# Patient Record
Sex: Female | Born: 1985 | Race: Black or African American | Hispanic: No | Marital: Single | Smoking: Never smoker
Health system: Southern US, Community
[De-identification: ages and names within clinical notes are randomized; demographics above are authoritative.]

## PROBLEM LIST (undated history)

## (undated) DIAGNOSIS — K219 Gastro-esophageal reflux disease without esophagitis: Secondary | ICD-10-CM

## (undated) DIAGNOSIS — T8859XA Other complications of anesthesia, initial encounter: Secondary | ICD-10-CM

## (undated) DIAGNOSIS — E669 Obesity, unspecified: Secondary | ICD-10-CM

## (undated) DIAGNOSIS — I10 Essential (primary) hypertension: Secondary | ICD-10-CM

## (undated) DIAGNOSIS — D649 Anemia, unspecified: Secondary | ICD-10-CM

## (undated) DIAGNOSIS — K8020 Calculus of gallbladder without cholecystitis without obstruction: Secondary | ICD-10-CM

## (undated) DIAGNOSIS — F419 Anxiety disorder, unspecified: Secondary | ICD-10-CM

## (undated) DIAGNOSIS — G43909 Migraine, unspecified, not intractable, without status migrainosus: Secondary | ICD-10-CM

---

## 2009-05-03 ENCOUNTER — Emergency Department (HOSPITAL_BASED_OUTPATIENT_CLINIC_OR_DEPARTMENT_OTHER): Admission: EM | Admit: 2009-05-03 | Discharge: 2009-05-03 | Payer: Self-pay | Admitting: Emergency Medicine

## 2009-06-13 ENCOUNTER — Emergency Department (HOSPITAL_BASED_OUTPATIENT_CLINIC_OR_DEPARTMENT_OTHER): Admission: EM | Admit: 2009-06-13 | Discharge: 2009-06-13 | Payer: Self-pay | Admitting: Emergency Medicine

## 2009-07-05 ENCOUNTER — Emergency Department (HOSPITAL_BASED_OUTPATIENT_CLINIC_OR_DEPARTMENT_OTHER): Admission: EM | Admit: 2009-07-05 | Discharge: 2009-07-05 | Payer: Self-pay | Admitting: Emergency Medicine

## 2009-11-17 ENCOUNTER — Emergency Department (HOSPITAL_BASED_OUTPATIENT_CLINIC_OR_DEPARTMENT_OTHER): Admission: EM | Admit: 2009-11-17 | Discharge: 2009-11-17 | Payer: Self-pay | Admitting: Emergency Medicine

## 2009-11-17 ENCOUNTER — Ambulatory Visit: Payer: Self-pay | Admitting: Radiology

## 2010-03-06 ENCOUNTER — Emergency Department (HOSPITAL_BASED_OUTPATIENT_CLINIC_OR_DEPARTMENT_OTHER): Admission: EM | Admit: 2010-03-06 | Discharge: 2010-03-07 | Payer: Self-pay | Admitting: Emergency Medicine

## 2010-06-22 ENCOUNTER — Emergency Department (HOSPITAL_BASED_OUTPATIENT_CLINIC_OR_DEPARTMENT_OTHER): Admission: EM | Admit: 2010-06-22 | Discharge: 2010-06-22 | Payer: Self-pay | Admitting: Emergency Medicine

## 2011-01-20 LAB — URINE MICROSCOPIC-ADD ON

## 2011-01-20 LAB — URINALYSIS, ROUTINE W REFLEX MICROSCOPIC
Glucose, UA: NEGATIVE mg/dL
Protein, ur: NEGATIVE mg/dL
pH: 5.5 (ref 5.0–8.0)

## 2011-01-20 LAB — URINE CULTURE: Colony Count: 25000

## 2011-02-11 LAB — URINE MICROSCOPIC-ADD ON

## 2011-02-11 LAB — URINALYSIS, ROUTINE W REFLEX MICROSCOPIC
Bilirubin Urine: NEGATIVE
Glucose, UA: NEGATIVE mg/dL
Protein, ur: NEGATIVE mg/dL

## 2011-09-29 ENCOUNTER — Emergency Department (INDEPENDENT_AMBULATORY_CARE_PROVIDER_SITE_OTHER): Payer: Self-pay

## 2011-09-29 ENCOUNTER — Emergency Department (HOSPITAL_BASED_OUTPATIENT_CLINIC_OR_DEPARTMENT_OTHER)
Admission: EM | Admit: 2011-09-29 | Discharge: 2011-09-29 | Disposition: A | Discharge status: Home / Self Care | Payer: Self-pay | Attending: Emergency Medicine | Admitting: Emergency Medicine

## 2011-09-29 DIAGNOSIS — R0989 Other specified symptoms and signs involving the circulatory and respiratory systems: Secondary | ICD-10-CM

## 2011-09-29 DIAGNOSIS — R05 Cough: Secondary | ICD-10-CM | POA: Insufficient documentation

## 2011-09-29 DIAGNOSIS — R509 Fever, unspecified: Secondary | ICD-10-CM | POA: Insufficient documentation

## 2011-09-29 DIAGNOSIS — R059 Cough, unspecified: Secondary | ICD-10-CM | POA: Insufficient documentation

## 2011-09-29 DIAGNOSIS — J40 Bronchitis, not specified as acute or chronic: Secondary | ICD-10-CM | POA: Insufficient documentation

## 2011-09-29 MED ORDER — AZITHROMYCIN 250 MG PO TABS: ORAL_TABLET | ORAL | Status: AC

## 2011-09-29 MED ORDER — ALBUTEROL SULFATE (5 MG/ML) 0.5% IN NEBU
5.0000 mg | INHALATION_SOLUTION | Freq: Once | RESPIRATORY_TRACT | Status: AC
  Administered 2011-09-29: 5 mg via RESPIRATORY_TRACT
  Filled 2011-09-29: qty 0.5

## 2011-09-29 MED ORDER — ALBUTEROL SULFATE HFA 108 (90 BASE) MCG/ACT IN AERS: 2.0000 | INHALATION_SPRAY | RESPIRATORY_TRACT | Status: DC | PRN

## 2011-09-29 NOTE — ED Notes (Signed)
Cough/wheezing x 1.5 months

## 2011-09-29 NOTE — ED Provider Notes (Signed)
History     CSN: [id number] Arrival date & time: 09/29/2011 11:15 AM   First MD Initiated Contact with Patient 09/29/11 1121      Chief Complaint  Patient presents with  . Cough    (Consider location/radiation/quality/duration/timing/severity/associated sxs/prior treatment) Patient is a 25 y.o. female presenting with cough. The history is provided by the patient.  Cough Pertinent negatives include no chest pain, no chills, no headaches, no shortness of breath and no eye redness.  pt c/o non prod cough for past few weeks. Feels wheezy at times. Denies hx asthma. Non smoker. No chest pain. No sob. Subjective fever. Mild nasal congestion. No sore throat. No headache. No known ill contacts. Cough episodic, no specific exacerbating or allev factors.   History reviewed. No pertinent past medical history.  History reviewed. No pertinent past surgical history.  No family history on file.  History  Substance Use Topics  . Smoking status: Never Smoker   . Smokeless tobacco: Not on file  . Alcohol Use: No    OB History    Grav Para Term Preterm Abortions TAB SAB Ect Mult Living                  Review of Systems  Constitutional: Positive for fever. Negative for chills.  HENT: Negative for neck pain and neck stiffness.   Eyes: Negative for redness.  Respiratory: Positive for cough. Negative for shortness of breath.   Cardiovascular: Negative for chest pain.  Gastrointestinal: Negative for abdominal pain.  Genitourinary: Negative for flank pain.  Musculoskeletal: Negative for back pain.  Skin: Negative for rash.  Neurological: Negative for headaches.  Hematological: Does not bruise/bleed easily.  Psychiatric/Behavioral: Negative for confusion.    Allergies  Review of patient's allergies indicates no known allergies.  Home Medications  No current outpatient prescriptions on file.  BP 143/81  Pulse 86  Temp(Src) 99.3 F (37.4 C) (Oral)  Resp 18  Ht 5\' 5"  (1.651 m)   Wt 200 lb (90.719 kg)  BMI 33.28 kg/m2  SpO2 100%  LMP 09/26/2011  Physical Exam  Nursing note and vitals reviewed. Constitutional: She is oriented to person, place, and time. She appears well-developed and well-nourished. No distress.  HENT:  Mouth/Throat: Oropharynx is clear and moist.  Eyes: Conjunctivae are normal. No scleral icterus.  Neck: Normal range of motion. Neck supple. No tracheal deviation present.       No stiffness/rigidity  Cardiovascular: Normal rate, regular rhythm, normal heart sounds and intact distal pulses.  Exam reveals no friction rub.   No murmur heard. Pulmonary/Chest: Effort normal. No respiratory distress. She has no rales.       Sl exp wheeze.   Abdominal: Soft. Normal appearance. There is no tenderness.  Musculoskeletal: She exhibits no edema.  Neurological: She is alert and oriented to person, place, and time.  Skin: Skin is warm and dry. No rash noted.  Psychiatric: She has a normal mood and affect.    ED Course  Procedures (including critical care time)  Labs Reviewed - No data to display Dg Chest 2 View  09/29/2011  *RADIOLOGY REPORT*  Clinical Data: Cough, congestion  CHEST - 2 VIEW  Comparison: 11/17/09  Findings: Cardiomediastinal silhouette is stable.  No acute infiltrate or pleural effusion.  No pulmonary edema.  Bony thorax is stable.  IMPRESSION: No active disease.  No significant change.  Original Report Authenticated By: Natasha Mead, M.D.      No diagnosis found.    MDM  Nursing  notes reviewed. Cxr. Albuterol neb treatment.    Pt notes prolonged cough, subj fevers, congestion on exam. Will rx bronchitis.      Suzi Roots, MD 09/29/11 [phone]

## 2014-02-26 ENCOUNTER — Encounter (HOSPITAL_BASED_OUTPATIENT_CLINIC_OR_DEPARTMENT_OTHER): Payer: Self-pay | Admitting: Emergency Medicine

## 2014-02-26 ENCOUNTER — Emergency Department (HOSPITAL_BASED_OUTPATIENT_CLINIC_OR_DEPARTMENT_OTHER)
Admission: EM | Admit: 2014-02-26 | Discharge: 2014-02-26 | Disposition: A | Discharge status: Home / Self Care | Payer: Medicaid Other | Attending: Emergency Medicine | Admitting: Emergency Medicine

## 2014-02-26 DIAGNOSIS — I10 Essential (primary) hypertension: Secondary | ICD-10-CM | POA: Insufficient documentation

## 2014-02-26 DIAGNOSIS — Z3202 Encounter for pregnancy test, result negative: Secondary | ICD-10-CM | POA: Insufficient documentation

## 2014-02-26 DIAGNOSIS — R51 Headache: Secondary | ICD-10-CM | POA: Insufficient documentation

## 2014-02-26 DIAGNOSIS — R42 Dizziness and giddiness: Secondary | ICD-10-CM | POA: Insufficient documentation

## 2014-02-26 DIAGNOSIS — R109 Unspecified abdominal pain: Secondary | ICD-10-CM

## 2014-02-26 DIAGNOSIS — M549 Dorsalgia, unspecified: Secondary | ICD-10-CM | POA: Insufficient documentation

## 2014-02-26 DIAGNOSIS — R1084 Generalized abdominal pain: Secondary | ICD-10-CM | POA: Insufficient documentation

## 2014-02-26 DIAGNOSIS — R519 Headache, unspecified: Secondary | ICD-10-CM

## 2014-02-26 DIAGNOSIS — Z79899 Other long term (current) drug therapy: Secondary | ICD-10-CM | POA: Insufficient documentation

## 2014-02-26 HISTORY — DX: Essential (primary) hypertension: I10

## 2014-02-26 LAB — URINALYSIS, ROUTINE W REFLEX MICROSCOPIC
Bilirubin Urine: NEGATIVE
GLUCOSE, UA: NEGATIVE mg/dL
Ketones, ur: NEGATIVE mg/dL
LEUKOCYTES UA: NEGATIVE
NITRITE: NEGATIVE
PROTEIN: NEGATIVE mg/dL
Specific Gravity, Urine: 1.028 (ref 1.005–1.030)
UROBILINOGEN UA: 0.2 mg/dL (ref 0.0–1.0)
pH: 5.5 (ref 5.0–8.0)

## 2014-02-26 LAB — URINE MICROSCOPIC-ADD ON

## 2014-02-26 LAB — PREGNANCY, URINE: PREG TEST UR: NEGATIVE

## 2014-02-26 MED ORDER — LANSOPRAZOLE 30 MG PO CPDR: 30.0000 mg | DELAYED_RELEASE_CAPSULE | Freq: Every day | ORAL | Status: DC

## 2014-02-26 MED ORDER — DOXYCYCLINE HYCLATE 100 MG PO CAPS: 100.0000 mg | ORAL_CAPSULE | Freq: Two times a day (BID) | ORAL | Status: DC

## 2014-02-26 MED ORDER — METRONIDAZOLE 500 MG PO TABS: 500.0000 mg | ORAL_TABLET | Freq: Two times a day (BID) | ORAL | Status: DC

## 2014-02-26 MED ORDER — SUMATRIPTAN SUCCINATE 100 MG PO TABS: 100.0000 mg | ORAL_TABLET | ORAL | Status: DC | PRN

## 2014-02-26 NOTE — Discharge Instructions (Signed)
Abdominal Pain, Women °Abdominal (stomach, pelvic, or belly) pain can be caused by many things. It is important to tell your doctor: °· The location of the pain. °· Does it come and go or is it present all the time? °· Are there things that start the pain (eating certain foods, exercise)? °· Are there other symptoms associated with the pain (fever, nausea, vomiting, diarrhea)? °All of this is helpful to know when trying to find the cause of the pain. °CAUSES  °· Stomach: virus or bacteria infection, or ulcer. °· Intestine: appendicitis (inflamed appendix), regional ileitis (Crohn's disease), ulcerative colitis (inflamed colon), irritable bowel syndrome, diverticulitis (inflamed diverticulum of the colon), or cancer of the stomach or intestine. °· Gallbladder disease or stones in the gallbladder. °· Kidney disease, kidney stones, or infection. °· Pancreas infection or cancer. °· Fibromyalgia (pain disorder). °· Diseases of the female organs: °· Uterus: fibroid (non-cancerous) tumors or infection. °· Fallopian tubes: infection or tubal pregnancy. °· Ovary: cysts or tumors. °· Pelvic adhesions (scar tissue). °· Endometriosis (uterus lining tissue growing in the pelvis and on the pelvic organs). °· Pelvic congestion syndrome (female organs filling up with blood just before the menstrual period). °· Pain with the menstrual period. °· Pain with ovulation (producing an egg). °· Pain with an IUD (intrauterine device, birth control) in the uterus. °· Cancer of the female organs. °· Functional pain (pain not caused by a disease, may improve without treatment). °· Psychological pain. °· Depression. °DIAGNOSIS  °Your doctor will decide the seriousness of your pain by doing an examination. °· Blood tests. °· X-rays. °· Ultrasound. °· CT scan (computed tomography, special type of X-ray). °· MRI (magnetic resonance imaging). °· Cultures, for infection. °· Barium enema (dye inserted in the large intestine, to better view it with  X-rays). °· Colonoscopy (looking in intestine with a lighted tube). °· Laparoscopy (minor surgery, looking in abdomen with a lighted tube). °· Major abdominal exploratory surgery (looking in abdomen with a large incision). °TREATMENT  °The treatment will depend on the cause of the pain.  °· Many cases can be observed and treated at home. °· Over-the-counter medicines recommended by your caregiver. °· Prescription medicine. °· Antibiotics, for infection. °· Birth control pills, for painful periods or for ovulation pain. °· Hormone treatment, for endometriosis. °· Nerve blocking injections. °· Physical therapy. °· Antidepressants. °· Counseling with a psychologist or psychiatrist. °· Minor or major surgery. °HOME CARE INSTRUCTIONS  °· Do not take laxatives, unless directed by your caregiver. °· Take over-the-counter pain medicine only if ordered by your caregiver. Do not take aspirin because it can cause an upset stomach or bleeding. °· Try a clear liquid diet (broth or water) as ordered by your caregiver. Slowly move to a bland diet, as tolerated, if the pain is related to the stomach or intestine. °· Have a thermometer and take your temperature several times a day, and record it. °· Bed rest and sleep, if it helps the pain. °· Avoid sexual intercourse, if it causes pain. °· Avoid stressful situations. °· Keep your follow-up appointments and tests, as your caregiver orders. °· If the pain does not go away with medicine or surgery, you may try: °· Acupuncture. °· Relaxation exercises (yoga, meditation). °· Group therapy. °· Counseling. °SEEK MEDICAL CARE IF:  °· You notice certain foods cause stomach pain. °· Your home care treatment is not helping your pain. °· You need stronger pain medicine. °· You want your IUD removed. °· You feel faint or   lightheaded.  You develop nausea and vomiting.  You develop a rash.  You are having side effects or an allergy to your medicine. SEEK IMMEDIATE MEDICAL CARE IF:   Your  pain does not go away or gets worse.  You have a fever.  Your pain is felt only in portions of the abdomen. The right side could possibly be appendicitis. The left lower portion of the abdomen could be colitis or diverticulitis.  You are passing blood in your stools (bright red or black tarry stools, with or without vomiting).  You have blood in your urine.  You develop chills, with or without a fever.  You pass out. MAKE SURE YOU:   Understand these instructions.  Will watch your condition.  Will get help right away if you are not doing well or get worse. Document Released: 08/20/2007 Document Revised: 01/15/2012 Document Reviewed: 09/09/2009 [facility] Patient Information 2014 [place], [place]. Headaches, Frequently Asked Questions MIGRAINE HEADACHES Q: What is migraine? What causes it? How can I treat it? A: Generally, migraine headaches begin as a dull ache. Then they develop into a constant, throbbing, and pulsating pain. You may experience pain at the temples. You may experience pain at the front or back of one or both sides of the head. The pain is usually accompanied by a combination of:  Nausea.  Vomiting.  Sensitivity to light and noise. Some people (about 15%) experience an aura (see below) before an attack. The cause of migraine is believed to be chemical reactions in the brain. Treatment for migraine may include over-the-counter or prescription medications. It may also include self-help techniques. These include relaxation training and biofeedback.  Q: What is an aura? A: About 15% of people with migraine get an "aura". This is a sign of neurological symptoms that occur before a migraine headache. You may see wavy or jagged lines, dots, or flashing lights. You might experience tunnel vision or blind spots in one or both eyes. The aura can include visual or auditory hallucinations (something imagined). It may include disruptions in smell (such as strange odors), taste or  touch. Other symptoms include:  Numbness.  A "pins and needles" sensation.  Difficulty in recalling or speaking the correct word. These neurological events may last as long as 60 minutes. These symptoms will fade as the headache begins. Q: What is a trigger? A: Certain physical or environmental factors can lead to or "trigger" a migraine. These include:  Foods.  Hormonal changes.  Weather.  Stress. It is important to remember that triggers are different for everyone. To help prevent migraine attacks, you need to figure out which triggers affect you. Keep a headache diary. This is a good way to track triggers. The diary will help you talk to your healthcare professional about your condition. Q: Does weather affect migraines? A: Bright sunshine, hot, humid conditions, and drastic changes in barometric pressure may lead to, or "trigger," a migraine attack in some people. But studies have shown that weather does not act as a trigger for everyone with migraines. Q: What is the link between migraine and hormones? A: Hormones start and regulate many of your body's functions. Hormones keep your body in balance within a constantly changing environment. The levels of hormones in your body are unbalanced at times. Examples are during menstruation, pregnancy, or menopause. That can lead to a migraine attack. In fact, about three quarters of all women with migraine report that their attacks are related to the menstrual cycle.  Q: Is there  an increased risk of stroke for migraine sufferers? A: The likelihood of a migraine attack causing a stroke is very remote. That is not to say that migraine sufferers cannot have a stroke associated with their migraines. In persons under age 28, the most common associated factor for stroke is migraine headache. But over the course of a person's normal life span, the occurrence of migraine headache may actually be associated with a reduced risk of dying from  cerebrovascular disease due to stroke.  Q: What are acute medications for migraine? A: Acute medications are used to treat the pain of the headache after it has started. Examples over-the-counter medications, NSAIDs, ergots, and triptans.  Q: What are the triptans? A: Triptans are the newest class of abortive medications. They are specifically targeted to treat migraine. Triptans are vasoconstrictors. They moderate some chemical reactions in the brain. The triptans work on receptors in your brain. Triptans help to restore the balance of a neurotransmitter called serotonin. Fluctuations in levels of serotonin are thought to be a main cause of migraine.  Q: Are over-the-counter medications for migraine effective? A: Over-the-counter, or "OTC," medications may be effective in relieving mild to moderate pain and associated symptoms of migraine. But you should see your caregiver before beginning any treatment regimen for migraine.  Q: What are preventive medications for migraine? A: Preventive medications for migraine are sometimes referred to as "prophylactic" treatments. They are used to reduce the frequency, severity, and length of migraine attacks. Examples of preventive medications include antiepileptic medications, antidepressants, beta-blockers, calcium channel blockers, and NSAIDs (nonsteroidal anti-inflammatory drugs). Q: Why are anticonvulsants used to treat migraine? A: During the past few years, there has been an increased interest in antiepileptic drugs for the prevention of migraine. They are sometimes referred to as "anticonvulsants". Both epilepsy and migraine may be caused by similar reactions in the brain.  Q: Why are antidepressants used to treat migraine? A: Antidepressants are typically used to treat people with depression. They may reduce migraine frequency by regulating chemical levels, such as serotonin, in the brain.  Q: What alternative therapies are used to treat migraine? A: The  term "alternative therapies" is often used to describe treatments considered outside the scope of conventional Western medicine. Examples of alternative therapy include acupuncture, acupressure, and yoga. Another common alternative treatment is herbal therapy. Some herbs are believed to relieve headache pain. Always discuss alternative therapies with your caregiver before proceeding. Some herbal products contain arsenic and other toxins. TENSION HEADACHES Q: What is a tension-type headache? What causes it? How can I treat it? A: Tension-type headaches occur randomly. They are often the result of temporary stress, anxiety, fatigue, or anger. Symptoms include soreness in your temples, a tightening band-like sensation around your head (a "vice-like" ache). Symptoms can also include a pulling feeling, pressure sensations, and contracting head and neck muscles. The headache begins in your forehead, temples, or the back of your head and neck. Treatment for tension-type headache may include over-the-counter or prescription medications. Treatment may also include self-help techniques such as relaxation training and biofeedback. CLUSTER HEADACHES Q: What is a cluster headache? What causes it? How can I treat it? A: Cluster headache gets its name because the attacks come in groups. The pain arrives with little, if any, warning. It is usually on one side of the head. A tearing or bloodshot eye and a runny nose on the same side of the headache may also accompany the pain. Cluster headaches are believed to be caused by chemical  reactions in the brain. They have been described as the most severe and intense of any headache type. Treatment for cluster headache includes prescription medication and oxygen. SINUS HEADACHES Q: What is a sinus headache? What causes it? How can I treat it? A: When a cavity in the bones of the face and skull (a sinus) becomes inflamed, the inflammation will cause localized pain. This condition  is usually the result of an allergic reaction, a tumor, or an infection. If your headache is caused by a sinus blockage, such as an infection, you will probably have a fever. An x-ray will confirm a sinus blockage. Your caregiver's treatment might include antibiotics for the infection, as well as antihistamines or decongestants.  REBOUND HEADACHES Q: What is a rebound headache? What causes it? How can I treat it? A: A pattern of taking acute headache medications too often can lead to a condition known as "rebound headache." A pattern of taking too much headache medication includes taking it more than 2 days per week or in excessive amounts. That means more than the label or a caregiver advises. With rebound headaches, your medications not only stop relieving pain, they actually begin to cause headaches. Doctors treat rebound headache by tapering the medication that is being overused. Sometimes your caregiver will gradually substitute a different type of treatment or medication. Stopping may be a challenge. Regularly overusing a medication increases the potential for serious side effects. Consult a caregiver if you regularly use headache medications more than 2 days per week or more than the label advises. ADDITIONAL QUESTIONS AND ANSWERS Q: What is biofeedback? A: Biofeedback is a self-help treatment. Biofeedback uses special equipment to monitor your body's involuntary physical responses. Biofeedback monitors:  Breathing.  Pulse.  Heart rate.  Temperature.  Muscle tension.  Brain activity. Biofeedback helps you refine and perfect your relaxation exercises. You learn to control the physical responses that are related to stress. Once the technique has been mastered, you do not need the equipment any more. Q: Are headaches hereditary? A: Four out of five (80%) of people that suffer report a family history of migraine. Scientists are not sure if this is genetic or a family predisposition. Despite  the uncertainty, a child has a 50% chance of having migraine if one parent suffers. The child has a 75% chance if both parents suffer.  Q: Can children get headaches? A: By the time they reach high school, most young people have experienced some type of headache. Many safe and effective approaches or medications can prevent a headache from occurring or stop it after it has begun.  Q: What type of doctor should I see to diagnose and treat my headache? A: Start with your primary caregiver. Discuss his or her experience and approach to headaches. Discuss methods of classification, diagnosis, and treatment. Your caregiver may decide to recommend you to a headache specialist, depending upon your symptoms or other physical conditions. Having diabetes, allergies, etc., may require a more comprehensive and inclusive approach to your headache. The National Headache Foundation will provide, upon request, a list of [facility] physician members in your state. Document Released: 01/13/2004 Document Revised: 01/15/2012 Document Reviewed: 06/22/2008 [facility] Patient Information 2014 [place], [place].

## 2014-02-26 NOTE — ED Provider Notes (Signed)
Medical screening examination/treatment/procedure(s) were performed by non-physician practitioner and as supervising physician I was immediately available for consultation/collaboration.   EKG Interpretation None        Sharene Krikorian B. Dovid Bartko, MD 02/26/14 2300 

## 2014-02-26 NOTE — ED Provider Notes (Signed)
CSN: [id number]     Arrival date & time 02/26/14  1740 History   First MD Initiated Contact with Patient 02/26/14 1804     Chief Complaint  Patient presents with  . Abdominal Cramping  . Headache  . Dizziness     (Consider location/radiation/quality/duration/timing/severity/associated sxs/prior Treatment) Patient is a 28 y.o. female presenting with back pain. The history is provided by the patient. No language interpreter was used.  Back Pain Location:  Generalized Quality:  Aching Radiates to:  Does not radiate Pain severity:  Moderate Pain is:  Worse during the day Onset quality:  Gradual Timing:  Constant Progression:  Worsening Chronicity:  New Context: not emotional stress   Relieved by:  Nothing Worsened by:  Nothing tried Associated symptoms: abdominal pain   Risk factors: not pregnant     Past Medical History  Diagnosis Date  . Hypertension    Past Surgical History  Procedure Laterality Date  . Cesarean section     No family history on file. History  Substance Use Topics  . Smoking status: Never Smoker   . Smokeless tobacco: Not on file  . Alcohol Use: No   OB History   Grav Para Term Preterm Abortions TAB SAB Ect Mult Living                 Review of Systems  Gastrointestinal: Positive for abdominal pain.  Musculoskeletal: Positive for back pain.  All other systems reviewed and are negative.     Allergies  Review of patient's allergies indicates no known allergies.  Home Medications   Prior to Admission medications   Medication Sig Start Date End Date Taking? Authorizing Provider  albuterol (PROVENTIL HFA;VENTOLIN HFA) 108 (90 BASE) MCG/ACT inhaler Inhale 2 puffs into the lungs every 4 (four) hours as needed for wheezing. 09/29/11 09/28/12  Suzi RootsKevin E Steinl, MD   BP 150/84  Pulse 100  Temp(Src) 98.1 F (36.7 C) (Oral)  Resp 18  Ht 5\' 5"  (1.651 m)  Wt 232 lb (105.235 kg)  BMI 38.61 kg/m2  SpO2 99%  LMP 02/10/2014 Physical Exam   Nursing note and vitals reviewed. Constitutional: She is oriented to person, place, and time. She appears well-developed and well-nourished.  HENT:  Head: Normocephalic.  Right Ear: External ear normal.  Left Ear: External ear normal.  Mouth/Throat: Oropharynx is clear and moist.  Eyes: Conjunctivae and EOM are normal. Pupils are equal, round, and reactive to light.  Neck: Normal range of motion.  Cardiovascular: Normal rate and normal heart sounds.   Pulmonary/Chest: Effort normal.  Abdominal: Soft. She exhibits no distension.  Musculoskeletal: Normal range of motion.  Neurological: She is alert and oriented to person, place, and time.  Psychiatric: She has a normal mood and affect.    ED Course  Procedures (including critical care time) Labs Review Labs Reviewed  URINALYSIS, ROUTINE W REFLEX MICROSCOPIC - Abnormal; Notable for the following:    APPearance CLOUDY (*)    Hgb urine dipstick TRACE (*)    All other components within normal limits  URINE MICROSCOPIC-ADD ON - Abnormal; Notable for the following:    Squamous Epithelial / LPF FEW (*)    Bacteria, UA FEW (*)    All other components within normal limits  PREGNANCY, URINE    Imaging Review No results found.   EKG Interpretation None      MDM  Pt is driving does not want injection for headache.   i will have her try imitrex.   Pt  has chronic abdominal pain.   Pt has been on a pink pill in the past.   I will try prevacid.   Final diagnoses:  Headache  Abdominal cramps    imitrex tablets.    Prevacid Follow up with your MD for recheck in 1 week    Elson AreasLeslie K Sofia, [place] JerseyPA-C 02/26/14 2123

## 2014-02-26 NOTE — ED Notes (Signed)
Pt reports multiple complaints.  Pt reports spasms in back and abdomen since giving birth but today started having headache and dizziness along with the abdominal spasms.  Denies sore throat, fever.

## 2014-04-29 ENCOUNTER — Emergency Department (HOSPITAL_BASED_OUTPATIENT_CLINIC_OR_DEPARTMENT_OTHER)
Admission: EM | Admit: 2014-04-29 | Discharge: 2014-04-29 | Disposition: A | Discharge status: Home / Self Care | Payer: Medicaid Other | Attending: Emergency Medicine | Admitting: Emergency Medicine

## 2014-04-29 ENCOUNTER — Encounter (HOSPITAL_BASED_OUTPATIENT_CLINIC_OR_DEPARTMENT_OTHER): Payer: Self-pay | Admitting: Emergency Medicine

## 2014-04-29 DIAGNOSIS — Z79899 Other long term (current) drug therapy: Secondary | ICD-10-CM | POA: Insufficient documentation

## 2014-04-29 DIAGNOSIS — J0100 Acute maxillary sinusitis, unspecified: Secondary | ICD-10-CM | POA: Insufficient documentation

## 2014-04-29 DIAGNOSIS — I10 Essential (primary) hypertension: Secondary | ICD-10-CM | POA: Insufficient documentation

## 2014-04-29 MED ORDER — AMOXICILLIN 500 MG PO CAPS
500.0000 mg | ORAL_CAPSULE | Freq: Two times a day (BID) | ORAL | Status: DC
Start: 2014-04-29 — End: 2014-07-29

## 2014-04-29 NOTE — ED Provider Notes (Signed)
CSN: [id number]     Arrival date & time 04/29/14  1635 History   First MD Initiated Contact with Patient 04/29/14 1644     Chief Complaint  Patient presents with  . URI     (Consider location/radiation/quality/duration/timing/severity/associated sxs/prior Treatment) Patient is a 28 y.o. female presenting with URI. The history is provided by the patient. No language interpreter was used.  URI Presenting symptoms: congestion   Presenting symptoms: no fatigue and no fever   Severity:  Moderate Onset quality:  Gradual Duration:  4 days Timing:  Constant Progression:  Worsening Chronicity:  New Relieved by:  Nothing Worsened by:  Nothing tried Ineffective treatments:  None tried Associated symptoms: sinus pain   Associated symptoms: no arthralgias and no neck pain   Risk factors: not elderly, no chronic cardiac disease, no chronic kidney disease, no immunosuppression, no recent illness and no recent travel     Past Medical History  Diagnosis Date  . Hypertension    Past Surgical History  Procedure Laterality Date  . Cesarean section     History reviewed. No pertinent family history. History  Substance Use Topics  . Smoking status: Never Smoker   . Smokeless tobacco: Not on file  . Alcohol Use: No   OB History   Grav Para Term Preterm Abortions TAB SAB Ect Mult Living                 Review of Systems  Constitutional: Negative for fever, chills and fatigue.  HENT: Positive for congestion and sinus pressure. Negative for trouble swallowing.   Eyes: Negative for visual disturbance.  Respiratory: Negative for shortness of breath.   Cardiovascular: Negative for chest pain and palpitations.  Gastrointestinal: Negative for nausea, vomiting, abdominal pain and diarrhea.  Genitourinary: Negative for dysuria and difficulty urinating.  Musculoskeletal: Negative for arthralgias and neck pain.  Skin: Negative for color change.  Neurological: Negative for dizziness and  weakness.  Psychiatric/Behavioral: Negative for dysphoric mood.      Allergies  Review of patient's allergies indicates no known allergies.  Home Medications   Prior to Admission medications   Medication Sig Start Date End Date Taking? Authorizing Provider  albuterol (PROVENTIL HFA;VENTOLIN HFA) 108 (90 BASE) MCG/ACT inhaler Inhale 2 puffs into the lungs every 4 (four) hours as needed for wheezing. 09/29/11 09/28/12  Suzi RootsKevin E Steinl, MD  lansoprazole (PREVACID) 30 MG capsule Take 1 capsule (30 mg total) by mouth daily at 12 noon. 02/26/14   Elson AreasLeslie K Sofia, PA-C  SUMAtriptan (IMITREX) 100 MG tablet Take 1 tablet (100 mg total) by mouth every 2 (two) hours as needed for migraine or headache. May repeat in 2 hours if headache persists or recurs. 02/26/14   Elson AreasLeslie K Sofia, PA-C   BP 160/75  Pulse 100  Temp(Src) 98.1 F (36.7 C) (Oral)  Resp 16  Ht 5\' 5"  (1.651 m)  Wt 215 lb (97.523 kg)  BMI 35.78 kg/m2  SpO2 98%  LMP 04/08/2014 Physical Exam  Nursing note and vitals reviewed. Constitutional: She is oriented to person, place, and time. She appears well-developed and well-nourished. No distress.  HENT:  Head: Normocephalic and atraumatic.  Mouth/Throat: Oropharynx is clear and moist. No oropharyngeal exudate.  Maxillary sinus tenderness to palpation.   Eyes: Conjunctivae and EOM are normal.  Neck: Normal range of motion.  Cardiovascular: Normal rate and regular rhythm.  Exam reveals no gallop and no friction rub.   No murmur heard. Pulmonary/Chest: Effort normal and breath sounds normal. She has  no wheezes. She has no rales. She exhibits no tenderness.  Musculoskeletal: Normal range of motion.  Lymphadenopathy:    She has cervical adenopathy.  Neurological: She is alert and oriented to person, place, and time. Coordination normal.  Speech is goal-oriented. Moves limbs without ataxia.   Skin: Skin is warm and dry.  Psychiatric: She has a normal mood and affect. Her behavior is  normal.    ED Course  Procedures (including critical care time) Labs Review Labs Reviewed - No data to display  Imaging Review No results found.   EKG Interpretation None      MDM   Final diagnoses:  Subacute maxillary sinusitis    5:21 PM Patient likely has sinusitis. I will treat her with amoxicillin. Vitals stable and patient afebrile. No further evaluation needed at this time.   Emilia BeckKaitlyn Szekalski, PA-C 04/29/14 1726

## 2014-04-29 NOTE — ED Notes (Signed)
Pt c/o URI symptoms x 2 days 

## 2014-04-29 NOTE — Discharge Instructions (Signed)
Take Amoxicillin as directed until gone. Refer to attached documents for more information.  °

## 2014-05-02 NOTE — ED Provider Notes (Signed)
Medical screening examination/treatment/procedure(s) were performed by non-physician practitioner and as supervising physician I was immediately available for consultation/collaboration.    Tylin Force, MD 05/02/14 0705 

## 2014-07-29 ENCOUNTER — Encounter (HOSPITAL_BASED_OUTPATIENT_CLINIC_OR_DEPARTMENT_OTHER): Payer: Self-pay | Admitting: Emergency Medicine

## 2014-07-29 ENCOUNTER — Other Ambulatory Visit: Payer: Self-pay | Admitting: General Surgery

## 2014-07-29 ENCOUNTER — Emergency Department (HOSPITAL_BASED_OUTPATIENT_CLINIC_OR_DEPARTMENT_OTHER)
Admission: EM | Admit: 2014-07-29 | Discharge: 2014-07-29 | Disposition: A | Discharge status: Other Acute Inpatient Hospital | Payer: Medicaid Other | Attending: Emergency Medicine | Admitting: Emergency Medicine

## 2014-07-29 ENCOUNTER — Emergency Department (HOSPITAL_BASED_OUTPATIENT_CLINIC_OR_DEPARTMENT_OTHER): Payer: Medicaid Other

## 2014-07-29 DIAGNOSIS — R109 Unspecified abdominal pain: Secondary | ICD-10-CM | POA: Diagnosis not present

## 2014-07-29 DIAGNOSIS — I10 Essential (primary) hypertension: Secondary | ICD-10-CM | POA: Insufficient documentation

## 2014-07-29 DIAGNOSIS — R1011 Right upper quadrant pain: Secondary | ICD-10-CM

## 2014-07-29 LAB — URINE MICROSCOPIC-ADD ON

## 2014-07-29 LAB — URINALYSIS, ROUTINE W REFLEX MICROSCOPIC
GLUCOSE, UA: NEGATIVE mg/dL
KETONES UR: NEGATIVE mg/dL
Nitrite: NEGATIVE
Protein, ur: NEGATIVE mg/dL
Specific Gravity, Urine: 1.018 (ref 1.005–1.030)
UROBILINOGEN UA: 1 mg/dL (ref 0.0–1.0)
pH: 7 (ref 5.0–8.0)

## 2014-07-29 LAB — CBC
HEMATOCRIT: 35.5 % — AB (ref 36.0–46.0)
Hemoglobin: 11.9 g/dL — ABNORMAL LOW (ref 12.0–15.0)
MCH: 26.3 pg (ref 26.0–34.0)
MCHC: 33.5 g/dL (ref 30.0–36.0)
MCV: 78.4 fL (ref 78.0–100.0)
Platelets: 271 10*3/uL (ref 150–400)
RBC: 4.53 MIL/uL (ref 3.87–5.11)
RDW: 13.5 % (ref 11.5–15.5)
WBC: 8 10*3/uL (ref 4.0–10.5)

## 2014-07-29 LAB — COMPREHENSIVE METABOLIC PANEL
ALK PHOS: 261 U/L — AB (ref 39–117)
ALT: 168 U/L — ABNORMAL HIGH (ref 0–35)
ANION GAP: 15 (ref 5–15)
AST: 185 U/L — AB (ref 0–37)
Albumin: 3.5 g/dL (ref 3.5–5.2)
BILIRUBIN TOTAL: 2.7 mg/dL — AB (ref 0.3–1.2)
BUN: 5 mg/dL — AB (ref 6–23)
CHLORIDE: 102 meq/L (ref 96–112)
CO2: 22 meq/L (ref 19–32)
CREATININE: 0.5 mg/dL (ref 0.50–1.10)
Calcium: 9.7 mg/dL (ref 8.4–10.5)
GFR calc Af Amer: >90 mL/min (ref >90)
Glucose, Bld: 101 mg/dL — ABNORMAL HIGH (ref 70–99)
POTASSIUM: 3.8 meq/L (ref 3.7–5.3)
Sodium: 139 mEq/L (ref 137–147)
Total Protein: 8.5 g/dL — ABNORMAL HIGH (ref 6.0–8.3)

## 2014-07-29 LAB — WET PREP, GENITAL
Trich, Wet Prep: NONE SEEN
Yeast Wet Prep HPF POC: NONE SEEN

## 2014-07-29 LAB — PREGNANCY, URINE: Preg Test, Ur: NEGATIVE

## 2014-07-29 LAB — LIPASE, BLOOD: LIPASE: 24 U/L (ref 11–59)

## 2014-07-29 MED ORDER — SODIUM CHLORIDE 0.9 % IV BOLUS (SEPSIS): 1000.0000 mL | Freq: Once | INTRAVENOUS | Status: DC

## 2014-07-29 MED ORDER — ONDANSETRON HCL 4 MG PO TABS: 4.0000 mg | ORAL_TABLET | Freq: Three times a day (TID) | ORAL | Status: DC | PRN

## 2014-07-29 MED ORDER — ACETAMINOPHEN 500 MG PO TABS
1000.0000 mg | ORAL_TABLET | Freq: Once | ORAL | Status: AC
  Administered 2014-07-29: 1000 mg via ORAL
  Filled 2014-07-29: qty 2

## 2014-07-29 MED ORDER — OXYCODONE-ACETAMINOPHEN 5-325 MG PO TABS: 1.0000 | ORAL_TABLET | ORAL | Status: DC | PRN

## 2014-07-29 NOTE — ED Notes (Signed)
Pt left without her ED d/c instructions and without signing.  rxs given to pt by [facility] NPs.

## 2014-07-29 NOTE — ED Notes (Signed)
[facility]  Here for admission for possible choecystectomy in the am.  Pt is now refusing to be admitted.  Wanting to leave.  NPs with [facility] [place] [facility] here at bedside to talk with pt.  They reports pt is going to be discharged.

## 2014-07-29 NOTE — ED Notes (Signed)
Pt refuses iv access, states "My veins are too hard to get, and it hurts too much." explained to pt that we cannot check her blood counts or diagnose her without bloodwork. CBC, chemistries and LFT's explained to pt, what their results can indicate and what we are looking for/ruling out with blood work. Pt states she will think about it and let me know. Pt assisted to restroom.

## 2014-07-29 NOTE — ED Notes (Signed)
Pt states that she needs to leave for work. Results of U/S reviewed with pt by this rn, explained that she has a serious medical issue that needs to be evaluated with labwork. Pt now agrees to stay, requests that this rn speak with her work [employer]ch[employer], [employer], states she will allow labs to be drawn, but refuses iv access. Labs reordered and sent to lab.

## 2014-07-29 NOTE — ED Provider Notes (Addendum)
Patient transferred from [facility] with gallstones. Surgery has seen the patient here and recommended admit but she wants to be discharged. Surgery will discharge patient.  Audree Camel, MD 07/29/14 [phone]

## 2014-07-29 NOTE — Discharge Instructions (Signed)
Go straight to the Emergency Room at [facility].  Do not eat or drink anything until you have discussed your treatment options with General Surgery.     Biliary Colic  Biliary colic is a steady or irregular pain in the upper abdomen. It is usually under the right side of the rib cage. It happens when gallstones interfere with the normal flow of bile from the gallbladder. Bile is a liquid that helps to digest fats. Bile is made in the liver and stored in the gallbladder. When you eat a meal, bile passes from the gallbladder through the cystic duct and the common bile duct into the small intestine. There, it mixes with partially digested food. If a gallstone blocks either of these ducts, the normal flow of bile is blocked. The muscle cells in the bile duct contract forcefully to try to move the stone. This causes the pain of biliary colic.  SYMPTOMS   A person with biliary colic usually complains of pain in the upper abdomen. This pain can be:  In the center of the upper abdomen just below the breastbone.  In the upper-right part of the abdomen, near the gallbladder and liver.  Spread back toward the right shoulder blade.  Nausea and vomiting.  The pain usually occurs after eating.  Biliary colic is usually triggered by the digestive system's demand for bile. The demand for bile is high after fatty meals. Symptoms can also occur when a person who has been fasting suddenly eats a very large meal. Most episodes of biliary colic pass after 1 to 5 hours. After the most intense pain passes, your abdomen may continue to ache mildly for about 24 hours. DIAGNOSIS  After you describe your symptoms, your caregiver will perform a physical exam. He or she will pay attention to the upper right portion of your belly (abdomen). This is the area of your liver and gallbladder. An ultrasound will help your caregiver look for gallstones. Specialized scans of the gallbladder may also be done. Blood tests  may be done, especially if you have fever or if your pain persists. PREVENTION  Biliary colic can be prevented by controlling the risk factors for gallstones. Some of these risk factors, such as heredity, increasing age, and pregnancy are a normal part of life. Obesity and a high-fat diet are risk factors you can change through a healthy lifestyle. Women going through menopause who take hormone replacement therapy (estrogen) are also more likely to develop biliary colic. TREATMENT   Pain medication may be prescribed.  You may be encouraged to eat a fat-free diet.  If the first episode of biliary colic is severe, or episodes of colic keep retuning, surgery to remove the gallbladder (cholecystectomy) is usually recommended. This procedure can be done through small incisions using an instrument called a laparoscope. The procedure often requires a brief stay in the hospital. Some people can leave the hospital the same day. It is the most widely used treatment in people troubled by painful gallstones. It is effective and safe, with no complications in more than 90% of cases.  If surgery cannot be done, medication that dissolves gallstones may be used. This medication is expensive and can take months or years to work. Only small stones will dissolve.  Rarely, medication to dissolve gallstones is combined with a procedure called shock-wave lithotripsy. This procedure uses carefully aimed shock waves to break up gallstones. In many people treated with this procedure, gallstones form again within a few years. PROGNOSIS  If gallstones block your cystic duct or common bile duct, you are at risk for repeated episodes of biliary colic. There is also a 25% chance that you will develop a gallbladder infection(acute cholecystitis), or some other complication of gallstones within 10 to 20 years. If you have surgery, schedule it at a time that is convenient for you and at a time when you are not sick. HOME CARE  INSTRUCTIONS   Drink plenty of clear fluids.  Avoid fatty, greasy or fried foods, or any foods that make your pain worse.  Take medications as directed. SEEK MEDICAL CARE IF:   You develop a fever over 100.5 F (38.1 C).  Your pain gets worse over time.  You develop nausea that prevents you from eating and drinking.  You develop vomiting. SEEK IMMEDIATE MEDICAL CARE IF:   You have continuous or severe belly (abdominal) pain which is not relieved with medications.  You develop nausea and vomiting which is not relieved with medications.  You have symptoms of biliary colic and you suddenly develop a fever and shaking chills. This may signal cholecystitis. Call your caregiver immediately.  You develop a yellow color to your skin or the white part of your eyes (jaundice). Document Released: 03/26/2006 Document Revised: 01/15/2012 Document Reviewed: 06/04/2008 [facility] Patient Information 2015 [place], [place]. This information is not intended to replace advice given to you by your health care provider. Make sure you discuss any questions you have with your health care provider.     ARRIVE BY 7AM TO SHORT STAY!!!!!!!!!!!

## 2014-07-29 NOTE — ED Notes (Signed)
Attempted IV start and blood draw to right A/c. Site would not thread. Pt refuse 2nd start even by another RN. Will make MD aware.

## 2014-07-29 NOTE — ED Notes (Signed)
Bed: NF62 Expected date:  Expected time:  Means of arrival:  Comments: Rory from triage

## 2014-07-29 NOTE — ED Notes (Signed)
Pt not in restroom, not in room. Called radiology, pt is in ultrasound.

## 2014-07-29 NOTE — ED Notes (Signed)
Pt left message with her supervisor to call her back and requests that this rn speak with him to excuse her from work today. She will call me when he calls her back.

## 2014-07-29 NOTE — ED Notes (Signed)
Pt requesting to go. Amy RN in to talk with pt about [place]. Pt decided to stay and have blood drawn.

## 2014-07-29 NOTE — ED Provider Notes (Signed)
CSN: [id number]     Arrival date & time 07/29/14  4098 History   First MD Initiated Contact with Patient 07/29/14 [phone]     Chief Complaint  Patient presents with  . Abdominal Pain     (Consider location/radiation/quality/duration/timing/severity/associated sxs/prior Treatment) Patient is a 28 y.o. female presenting with abdominal pain.  Abdominal Pain Pain location:  R flank and RUQ Pain quality: aching   Pain severity:  Moderate Onset quality:  Gradual Duration:  12 days Timing:  Constant Progression:  Worsening Chronicity:  New Context comment:  Started after eating a hot dog Relieved by:  Nothing Worsened by:  Nothing tried Associated symptoms: nausea and vaginal bleeding (Spotting)   Associated symptoms: no diarrhea, no dysuria, no fever, no vaginal discharge and no vomiting     Past Medical History  Diagnosis Date  . Hypertension    Past Surgical History  Procedure Laterality Date  . Cesarean section     No family history on file. History  Substance Use Topics  . Smoking status: Never Smoker   . Smokeless tobacco: Not on file  . Alcohol Use: No   OB History   Grav Para Term Preterm Abortions TAB SAB Ect Mult Living                 Review of Systems  Constitutional: Negative for fever.  Gastrointestinal: Positive for nausea and abdominal pain. Negative for vomiting and diarrhea.  Genitourinary: Positive for vaginal bleeding (Spotting). Negative for dysuria and vaginal discharge.  All other systems reviewed and are negative.     Allergies  Review of patient's allergies indicates no known allergies.  Home Medications   Prior to Admission medications   Medication Sig Start Date End Date Taking? Authorizing Provider  albuterol (PROVENTIL HFA;VENTOLIN HFA) 108 (90 BASE) MCG/ACT inhaler Inhale 2 puffs into the lungs every 4 (four) hours as needed for wheezing. 09/29/11 09/28/12  Suzi Roots, MD   BP 133/86  Pulse 91  Temp(Src) 98.2 F (36.8 C)  (Oral)  Resp 18  Wt 220 lb (99.791 kg)  SpO2 97%  LMP 07/27/2014 Physical Exam  Nursing note and vitals reviewed. Constitutional: She is oriented to person, place, and time. She appears well-developed and well-nourished. No distress.  HENT:  Head: Normocephalic and atraumatic.  Mouth/Throat: Oropharynx is clear and moist.  Eyes: Conjunctivae are normal. Pupils are equal, round, and reactive to light. No scleral icterus.  Neck: Neck supple.  Cardiovascular: Normal rate, regular rhythm, normal heart sounds and intact distal pulses.   No murmur heard. Pulmonary/Chest: Effort normal and breath sounds normal. No stridor. No respiratory distress. She has no rales.  Abdominal: Soft. Bowel sounds are normal. She exhibits no distension. There is tenderness in the right upper quadrant. There is CVA tenderness (Right). There is no rigidity, no rebound and no guarding.  Genitourinary: There is no tenderness on the right labia. There is no tenderness on the left labia. Uterus is not enlarged and not tender. Cervix exhibits no motion tenderness and no friability. Right adnexum displays no tenderness. Left adnexum displays no tenderness.  Small amount of blood in vaginal vault  Musculoskeletal: Normal range of motion.  Neurological: She is alert and oriented to person, place, and time.  Skin: Skin is warm and dry. No rash noted.  Psychiatric: She has a normal mood and affect. Her behavior is normal.    ED Course  Procedures (including critical care time) Labs Review Labs Reviewed  WET PREP, GENITAL - Abnormal;  Notable for the following:    Clue Cells Wet Prep HPF POC MANY (*)    WBC, Wet Prep HPF POC FEW (*)    All other components within normal limits  URINALYSIS, ROUTINE W REFLEX MICROSCOPIC - Abnormal; Notable for the following:    Color, Urine AMBER (*)    APPearance CLOUDY (*)    Hgb urine dipstick LARGE (*)    Bilirubin Urine SMALL (*)    Leukocytes, UA MODERATE (*)    All other  components within normal limits  URINE MICROSCOPIC-ADD ON - Abnormal; Notable for the following:    Squamous Epithelial / LPF FEW (*)    Bacteria, UA MANY (*)    All other components within normal limits  CBC - Abnormal; Notable for the following:    Hemoglobin 11.9 (*)    HCT 35.5 (*)    All other components within normal limits  COMPREHENSIVE METABOLIC PANEL - Abnormal; Notable for the following:    Glucose, Bld 101 (*)    BUN 5 (*)    Total Protein 8.5 (*)    AST 185 (*)    ALT 168 (*)    Alkaline Phosphatase 261 (*)    Total Bilirubin 2.7 (*)    All other components within normal limits  GC/CHLAMYDIA PROBE AMP  PREGNANCY, URINE  LIPASE, BLOOD    Imaging Review US Abdomen Complete  07/29/2014   CLINICAL DATA:  RIGHT-side abdominal pain and nausea since last night chronic back pain, Murphy sign  EXAM: ULTRASOUND ABDOMEN COMPLETE  COMPARISON:  None.  FINDINGS: Gallbladder:  Multiple small shadowing gallstones largest 5 mm diameter. No gallbladder wall thickening or pericholecystic fluid. Sonographic Eulah Pont sign is present per sonographer.  Common bile duct:  Diameter: 3 mm diameter, normal  Liver:  Normal echogenicity without mass or nodularity. A few images suggest presence of mild central intrahepatic biliary dilatation although this is inconsistent. Hepatopetal portal venous flow.  IVC:  Incompletely visualized due to bowel gas, visualized portion normal appearance.  Pancreas:  Incompletely visualized due to bowel gas, visualized portion normal appearance  Spleen:  Normal appearance, 5.3 cm length  Right Kidney:  Length: 10.8 cm.  Normal morphology without mass or hydronephrosis.  Left Kidney:  Length: 10.9 cm.  Normal morphology without mass or hydronephrosis.  Abdominal aorta:  Mid to distal portions obscured by bowel gas. Visualized portion normal caliber.  Other findings:  No free fluid  IMPRESSION: Multiple small gallstones within gallbladder without definite wall thickening or  gross cholecystic fluid though sonographic Murphy sign was noted at imaging ; early acute cholecystitis not entirely excluded.  If acute cholecystitis remains a clinical concern, consider radionuclide hepatobiliary imaging for further assessment.  Question mild central intrahepatic biliary dilatation though no CBD dilatation is identified ; recommend correlation with LFTs.   Electronically Signed   By: Ulyses Southward M.D.   On: 07/29/2014 10:52     EKG Interpretation None      MDM   Final diagnoses:  RUQ abdominal pain    28 year old female presenting with right upper quadrant abdominal pain. Also has right flank/right mid back pain, which she says occurs occasionally since delivering her son.  However, the right upper quadrant abdominal pain is new for her.  2:41 PM right upper quadrant ultrasound shows gallstones.  It is otherwise indeterminate for early cholecystitis. Her LFTs are mildly elevated. She continued to have right upper quadrant pain and tenderness, although improved from initial.  I discussed her case with Dr.  Daphine Deutscher (general surgery) who will evaluate patient at [facility].    Of note, her ED course was prolonged because she initially refused to have blood work done.   Candyce Churn III, MD 07/29/14 [phone]

## 2014-07-29 NOTE — ED Notes (Signed)
Patient here with lower abdominal pain that she describes as more right sided and lower backpain with nausea since last pm. Denies urinary symptoms

## 2014-07-29 NOTE — H&P (Signed)
Chief Complaint: RUQ abdominal pain HPI: Melinda Frost is a 28 year old female with a history of hypertension and c section in December who presents from [facility] with RUQ abdominal pain.  Duration of symptoms is 1 day.  Onset was sudden.  Coarse is improving.  Time pattern is constant.  Characterized as sharp pain with radiation to the back.   Aggravated by PO intake, fatty foods.  No modifying factors.  Alleviated by tylenol which she received at [facility].Marland Kitchen Her work up includes an [place] of abdomen significant for gallstones , without definitive wall thickening, +murphy's sign, 49m CBD, abnormal LFTs with a bilirubin of 2.7, normal white count.  She denies previous symptoms.  She is no longer on any blood pressure medication.  Denies use of any blood thinners.   Past Medical History  Diagnosis Date  . Hypertension     Past Surgical History  Procedure Laterality Date  . Cesarean section      Family History  Problem Relation Age of Onset  . Diabetes Mother   . Hypertension Mother   . Heart disease Father   . Breast cancer Sister    Social History:  reports that she has never smoked. She does not have any smokeless tobacco history on file. She reports that she does not drink alcohol or use illicit drugs.  Allergies: No Known Allergies Medication Prior to Admission medications   Medication Sig Start Date End Date Taking? Authorizing Provider  acetaminophen (TYLENOL) 500 MG tablet Take 500 mg by mouth every 6 (six) hours as needed.   Yes Historical Provider, MD  albuterol (PROVENTIL HFA;VENTOLIN HFA) 108 (90 BASE) MCG/ACT inhaler Inhale 2 puffs into the lungs every 4 (four) hours as needed for wheezing. 09/29/11 09/28/12  KMirna Mires MD     (Not in a hospital admission)  Results for orders placed during the hospital encounter of 07/29/14 (from the past 48 hour(s))  URINALYSIS, ROUTINE W REFLEX MICROSCOPIC     Status: Abnormal   Collection Time    07/29/14  9:21 AM      Result Value Ref  Range   Color, Urine AMBER (*) YELLOW   Comment: BIOCHEMICALS MAY BE AFFECTED BY COLOR   APPearance CLOUDY (*) CLEAR   Specific Gravity, Urine 1.018  1.005 - 1.030   pH 7.0  5.0 - 8.0   Glucose, UA NEGATIVE  NEGATIVE mg/dL   Hgb urine dipstick LARGE (*) NEGATIVE   Bilirubin Urine SMALL (*) NEGATIVE   Ketones, ur NEGATIVE  NEGATIVE mg/dL   Protein, ur NEGATIVE  NEGATIVE mg/dL   Urobilinogen, UA 1.0  0.0 - 1.0 mg/dL   Nitrite NEGATIVE  NEGATIVE   Leukocytes, UA MODERATE (*) NEGATIVE  PREGNANCY, URINE     Status: None   Collection Time    07/29/14  9:21 AM      Result Value Ref Range   Preg Test, Ur NEGATIVE  NEGATIVE   Comment:            THE SENSITIVITY OF THIS     METHODOLOGY IS >20 mIU/mL.  URINE MICROSCOPIC-ADD ON     Status: Abnormal   Collection Time    07/29/14  9:21 AM      Result Value Ref Range   Squamous Epithelial / LPF FEW (*) RARE   WBC, UA 21-50  <3 WBC/hpf   RBC / HPF 11-20  <3 RBC/hpf   Bacteria, UA MANY (*) RARE  WET PREP, GENITAL     Status: Abnormal  Collection Time    07/29/14 10:43 AM      Result Value Ref Range   Yeast Wet Prep HPF POC NONE SEEN  NONE SEEN   Trich, Wet Prep NONE SEEN  NONE SEEN   Clue Cells Wet Prep HPF POC MANY (*) NONE SEEN   WBC, Wet Prep HPF POC FEW (*) NONE SEEN  CBC     Status: Abnormal   Collection Time    07/29/14 12:30 PM      Result Value Ref Range   WBC 8.0  4.0 - 10.5 K/uL   RBC 4.53  3.87 - 5.11 MIL/uL   Hemoglobin 11.9 (*) 12.0 - 15.0 g/dL   HCT 35.5 (*) 36.0 - 46.0 %   MCV 78.4  78.0 - 100.0 fL   MCH 26.3  26.0 - 34.0 pg   MCHC 33.5  30.0 - 36.0 g/dL   RDW 13.5  11.5 - 15.5 %   Platelets 271  150 - 400 K/uL  COMPREHENSIVE METABOLIC PANEL     Status: Abnormal   Collection Time    07/29/14 12:30 PM      Result Value Ref Range   Sodium 139  137 - 147 mEq/L   Potassium 3.8  3.7 - 5.3 mEq/L   Chloride 102  96 - 112 mEq/L   CO2 22  19 - 32 mEq/L   Glucose, Bld 101 (*) 70 - 99 mg/dL   BUN 5 (*) 6 - 23 mg/dL    Creatinine, Ser 0.50  0.50 - 1.10 mg/dL   Calcium 9.7  8.4 - 10.5 mg/dL   Total Protein 8.5 (*) 6.0 - 8.3 g/dL   Albumin 3.5  3.5 - 5.2 g/dL   AST 185 (*) 0 - 37 U/L   ALT 168 (*) 0 - 35 U/L   Alkaline Phosphatase 261 (*) 39 - 117 U/L   Total Bilirubin 2.7 (*) 0.3 - 1.2 mg/dL   GFR calc non Af Amer >90  >90 mL/min   GFR calc Af Amer >90  >90 mL/min   Comment: (NOTE)     The eGFR has been calculated using the CKD EPI equation.     This calculation has not been validated in all clinical situations.     eGFR's persistently <90 mL/min signify possible Chronic Kidney     Disease.   Anion gap 15  5 - 15  LIPASE, BLOOD     Status: None   Collection Time    07/29/14 12:30 PM      Result Value Ref Range   Lipase 24  11 - 59 U/L   US Abdomen Complete  07/29/2014   CLINICAL DATA:  RIGHT-side abdominal pain and nausea since last night chronic back pain, Murphy sign  EXAM: ULTRASOUND ABDOMEN COMPLETE  COMPARISON:  None.  FINDINGS: Gallbladder:  Multiple small shadowing gallstones largest 5 mm diameter. No gallbladder wall thickening or pericholecystic fluid. Sonographic Percell Miller sign is present per sonographer.  Common bile duct:  Diameter: 3 mm diameter, normal  Liver:  Normal echogenicity without mass or nodularity. A few images suggest presence of mild central intrahepatic biliary dilatation although this is inconsistent. Hepatopetal portal venous flow.  IVC:  Incompletely visualized due to bowel gas, visualized portion normal appearance.  Pancreas:  Incompletely visualized due to bowel gas, visualized portion normal appearance  Spleen:  Normal appearance, 5.3 cm length  Right Kidney:  Length: 10.8 cm.  Normal morphology without mass or hydronephrosis.  Left Kidney:  Length: 10.9 cm.  Normal morphology without mass or hydronephrosis.  Abdominal aorta:  Mid to distal portions obscured by bowel gas. Visualized portion normal caliber.  Other findings:  No free fluid  IMPRESSION: Multiple small gallstones  within gallbladder without definite wall thickening or gross cholecystic fluid though sonographic Murphy sign was noted at imaging ; early acute cholecystitis not entirely excluded.  If acute cholecystitis remains a clinical concern, consider radionuclide hepatobiliary imaging for further assessment.  Question mild central intrahepatic biliary dilatation though no CBD dilatation is identified ; recommend correlation with LFTs.   Electronically Signed   By: Lavonia Dana M.D.   On: 07/29/2014 10:52    Review of Systems  All other systems reviewed and are negative.   Blood pressure 135/61, pulse 88, temperature 98.1 F (36.7 C), temperature source Oral, resp. rate 18, weight 220 lb (99.791 kg), last menstrual period 07/27/2014, SpO2 100.00%. Physical Exam  Constitutional: She is oriented to person, place, and time. She appears well-developed and well-nourished. No distress.  Neck: Normal range of motion. Neck supple.  Cardiovascular: Normal rate, regular rhythm, normal heart sounds and intact distal pulses.   Respiratory: Effort normal and breath sounds normal. No respiratory distress. She has no wheezes. She has no rales. She exhibits no tenderness.  GI: Soft. Bowel sounds are normal. She exhibits no distension and no mass. There is no rebound and no guarding.  Mild ttp to RUQ, negative murphy's sign  Musculoskeletal: Normal range of motion. She exhibits no edema and no tenderness.  Lymphadenopathy:    She has no cervical adenopathy.  Neurological: She is alert and oriented to person, place, and time.  Skin: Skin is warm and dry. No rash noted. She is not diaphoretic. No erythema. No pallor.  Psychiatric: She has a normal mood and affect. Her behavior is normal. Judgment and thought content normal.     Assessment/Plan Biliary colic with possible early cholecystitis Possible choledocholithiasis Abnormal LFTs  We recommend admission, however, the patient wishes to go home. Her CBD is normal  on the [place], but bilirubin is 2.7, she may have passed a stone.  In which case we will set her up for repeat labs tomorrow morning, if LFTs are trending down then we will proceed with a laparoscopic cholecystectomy with IOC there after.We discussed the risks of the surgery including but not limited to infection, bleeding, injury to surrounding structures.  She verbalizes understanding  She was advised to consume liquids only, NPO after midnight.  She was provided with PO pain medication.  She knows to return to the ED should her symptoms worsen.  Marland Kitchen    RIEBOCK, EMINA ANP-BC 07/29/2014, 4:15 PM

## 2014-07-29 NOTE — ED Notes (Signed)
Pt returned from u/s in w/c. Pt cont refuse iv or labs, "I don't want to be all bruised up for work!" dr. Loretha Stapler notified, orders cancelled.

## 2014-07-30 ENCOUNTER — Ambulatory Visit (HOSPITAL_COMMUNITY): Admission: RE | Admit: 2014-07-30 | Payer: Medicaid Other | Source: Ambulatory Visit | Admitting: Surgery

## 2014-07-30 ENCOUNTER — Encounter (HOSPITAL_COMMUNITY): Admission: RE | Payer: Self-pay | Source: Ambulatory Visit

## 2014-07-30 LAB — GC/CHLAMYDIA PROBE AMP
CT Probe RNA: NEGATIVE
GC Probe RNA: NEGATIVE

## 2014-07-30 SURGERY — LAPAROSCOPIC CHOLECYSTECTOMY WITH INTRAOPERATIVE CHOLANGIOGRAM
Anesthesia: General

## 2014-08-11 ENCOUNTER — Encounter (HOSPITAL_BASED_OUTPATIENT_CLINIC_OR_DEPARTMENT_OTHER): Payer: Self-pay | Admitting: Emergency Medicine

## 2014-08-11 ENCOUNTER — Inpatient Hospital Stay (HOSPITAL_BASED_OUTPATIENT_CLINIC_OR_DEPARTMENT_OTHER)
Admission: EM | Admit: 2014-08-11 | Discharge: 2014-08-13 | DRG: 446 | Disposition: A | Discharge status: Home / Self Care | Payer: Medicaid Other | Attending: General Surgery | Admitting: General Surgery

## 2014-08-11 ENCOUNTER — Emergency Department (HOSPITAL_BASED_OUTPATIENT_CLINIC_OR_DEPARTMENT_OTHER): Payer: Medicaid Other

## 2014-08-11 DIAGNOSIS — R1011 Right upper quadrant pain: Secondary | ICD-10-CM | POA: Diagnosis not present

## 2014-08-11 DIAGNOSIS — Z833 Family history of diabetes mellitus: Secondary | ICD-10-CM

## 2014-08-11 DIAGNOSIS — Z803 Family history of malignant neoplasm of breast: Secondary | ICD-10-CM | POA: Diagnosis not present

## 2014-08-11 DIAGNOSIS — E669 Obesity, unspecified: Secondary | ICD-10-CM | POA: Diagnosis not present

## 2014-08-11 DIAGNOSIS — Z6836 Body mass index (BMI) 36.0-36.9, adult: Secondary | ICD-10-CM | POA: Diagnosis not present

## 2014-08-11 DIAGNOSIS — R1013 Epigastric pain: Secondary | ICD-10-CM | POA: Diagnosis present

## 2014-08-11 DIAGNOSIS — K8070 Calculus of gallbladder and bile duct without cholecystitis without obstruction: Secondary | ICD-10-CM | POA: Diagnosis not present

## 2014-08-11 DIAGNOSIS — Z8249 Family history of ischemic heart disease and other diseases of the circulatory system: Secondary | ICD-10-CM | POA: Diagnosis not present

## 2014-08-11 DIAGNOSIS — K8050 Calculus of bile duct without cholangitis or cholecystitis without obstruction: Secondary | ICD-10-CM | POA: Diagnosis not present

## 2014-08-11 DIAGNOSIS — I10 Essential (primary) hypertension: Secondary | ICD-10-CM | POA: Diagnosis not present

## 2014-08-11 DIAGNOSIS — Z79899 Other long term (current) drug therapy: Secondary | ICD-10-CM | POA: Diagnosis not present

## 2014-08-11 DIAGNOSIS — Z888 Allergy status to other drugs, medicaments and biological substances status: Secondary | ICD-10-CM | POA: Diagnosis not present

## 2014-08-11 DIAGNOSIS — K8060 Calculus of gallbladder and bile duct with cholecystitis, unspecified, without obstruction: Secondary | ICD-10-CM | POA: Diagnosis not present

## 2014-08-11 DIAGNOSIS — R748 Abnormal levels of other serum enzymes: Secondary | ICD-10-CM

## 2014-08-11 DIAGNOSIS — K819 Cholecystitis, unspecified: Secondary | ICD-10-CM | POA: Diagnosis present

## 2014-08-11 HISTORY — DX: Obesity, unspecified: E66.9

## 2014-08-11 HISTORY — DX: Calculus of gallbladder without cholecystitis without obstruction: K80.20

## 2014-08-11 LAB — CBC WITH DIFFERENTIAL/PLATELET
BASOS ABS: 0 10*3/uL (ref 0.0–0.1)
Basophils Relative: 0 % (ref 0–1)
EOS ABS: 0 10*3/uL (ref 0.0–0.7)
EOS PCT: 0 % (ref 0–5)
HEMATOCRIT: 34.1 % — AB (ref 36.0–46.0)
HEMOGLOBIN: 11.2 g/dL — AB (ref 12.0–15.0)
Lymphocytes Relative: 14 % (ref 12–46)
Lymphs Abs: 1.1 10*3/uL (ref 0.7–4.0)
MCH: 26 pg (ref 26.0–34.0)
MCHC: 32.8 g/dL (ref 30.0–36.0)
MCV: 79.3 fL (ref 78.0–100.0)
MONO ABS: 0.6 10*3/uL (ref 0.1–1.0)
MONOS PCT: 8 % (ref 3–12)
NEUTROS ABS: 5.7 10*3/uL (ref 1.7–7.7)
Neutrophils Relative %: 78 % — ABNORMAL HIGH (ref 43–77)
Platelets: 263 10*3/uL (ref 150–400)
RBC: 4.3 MIL/uL (ref 3.87–5.11)
RDW: 13.7 % (ref 11.5–15.5)
WBC: 7.3 10*3/uL (ref 4.0–10.5)

## 2014-08-11 LAB — COMPREHENSIVE METABOLIC PANEL
ALT: 152 U/L — ABNORMAL HIGH (ref 0–35)
ANION GAP: 13 (ref 5–15)
AST: 241 U/L — AB (ref 0–37)
Albumin: 3.4 g/dL — ABNORMAL LOW (ref 3.5–5.2)
Alkaline Phosphatase: 197 U/L — ABNORMAL HIGH (ref 39–117)
BILIRUBIN TOTAL: 1.3 mg/dL — AB (ref 0.3–1.2)
BUN: 8 mg/dL (ref 6–23)
CALCIUM: 9.5 mg/dL (ref 8.4–10.5)
CHLORIDE: 102 meq/L (ref 96–112)
CO2: 23 mEq/L (ref 19–32)
Creatinine, Ser: 0.6 mg/dL (ref 0.50–1.10)
GFR calc Af Amer: >90 mL/min (ref >90)
GFR calc non Af Amer: >90 mL/min (ref >90)
Glucose, Bld: 113 mg/dL — ABNORMAL HIGH (ref 70–99)
Potassium: 3.9 mEq/L (ref 3.7–5.3)
Sodium: 138 mEq/L (ref 137–147)
TOTAL PROTEIN: 8.1 g/dL (ref 6.0–8.3)

## 2014-08-11 LAB — URINALYSIS, ROUTINE W REFLEX MICROSCOPIC
Bilirubin Urine: NEGATIVE
Glucose, UA: NEGATIVE mg/dL
HGB URINE DIPSTICK: NEGATIVE
Ketones, ur: NEGATIVE mg/dL
NITRITE: NEGATIVE
Protein, ur: NEGATIVE mg/dL
SPECIFIC GRAVITY, URINE: 1.015 (ref 1.005–1.030)
Urobilinogen, UA: 1 mg/dL (ref 0.0–1.0)
pH: 7 (ref 5.0–8.0)

## 2014-08-11 LAB — LIPASE, BLOOD: Lipase: 19 U/L (ref 11–59)

## 2014-08-11 LAB — PREGNANCY, URINE: PREG TEST UR: NEGATIVE

## 2014-08-11 LAB — URINE MICROSCOPIC-ADD ON

## 2014-08-11 MED ORDER — PANTOPRAZOLE SODIUM 40 MG IV SOLR
40.0000 mg | Freq: Every day | INTRAVENOUS | Status: DC
  Administered 2014-08-11 – 2014-08-12 (×2): 40 mg via INTRAVENOUS
  Filled 2014-08-11 (×3): qty 40

## 2014-08-11 MED ORDER — KCL-LACTATED RINGERS-D5W 20 MEQ/L IV SOLN
INTRAVENOUS | Status: DC
  Administered 2014-08-11: 23:00:00 via INTRAVENOUS
  Administered 2014-08-12 (×2): 100 mL/h via INTRAVENOUS
  Administered 2014-08-13: 04:00:00 via INTRAVENOUS
  Filled 2014-08-11 (×7): qty 1000

## 2014-08-11 MED ORDER — ONDANSETRON HCL 4 MG/2ML IJ SOLN: 4.0000 mg | INTRAMUSCULAR | Status: DC | PRN

## 2014-08-11 MED ORDER — ALBUTEROL SULFATE (2.5 MG/3ML) 0.083% IN NEBU: 2.5000 mg | INHALATION_SOLUTION | RESPIRATORY_TRACT | Status: DC | PRN

## 2014-08-11 MED ORDER — MORPHINE SULFATE 2 MG/ML IJ SOLN: 2.0000 mg | INTRAMUSCULAR | Status: DC | PRN

## 2014-08-11 MED ORDER — HYDROMORPHONE HCL 1 MG/ML IJ SOLN: 1.0000 mg | INTRAMUSCULAR | Status: AC | PRN

## 2014-08-11 MED ORDER — ONDANSETRON HCL 4 MG/2ML IJ SOLN: 4.0000 mg | Freq: Three times a day (TID) | INTRAMUSCULAR | Status: DC | PRN

## 2014-08-11 MED ORDER — SODIUM CHLORIDE 0.9 % IV SOLN
INTRAVENOUS | Status: AC
  Administered 2014-08-11: 21:00:00 via INTRAVENOUS

## 2014-08-11 MED ORDER — ONDANSETRON HCL 4 MG/2ML IJ SOLN
4.0000 mg | Freq: Once | INTRAMUSCULAR | Status: DC
  Filled 2014-08-11: qty 2

## 2014-08-11 NOTE — ED Provider Notes (Signed)
CSN: [id number]     Arrival date & time 08/11/14  1431 History   First MD Initiated Contact with Patient 08/11/14 1506     Chief Complaint  Patient presents with  . Abdominal Pain     (Consider location/radiation/quality/duration/timing/severity/associated sxs/prior Treatment) Patient is a 28 y.o. female presenting with abdominal pain. The history is provided by the patient. No language interpreter was used.  Abdominal Pain Pain location:  RUQ Pain quality: aching   Pain radiates to:  R flank Pain severity:  Moderate Onset quality:  Sudden Duration:  2 weeks Timing:  Intermittent Progression:  Waxing and waning Chronicity:  New Context comment:  Diagnosed w/ gallstones on 9/23 Relieved by:  Nothing Exacerbated by: unsure, reported worse w/ PO intake on 9/23 note. Ineffective treatments:  None tried Associated symptoms: anorexia and nausea   Associated symptoms: no chest pain, no chills, no constipation, no cough, no diarrhea, no dysuria, no fatigue, no fever, no shortness of breath, no sore throat and no vomiting   Risk factors: obesity     Past Medical History  Diagnosis Date  . Hypertension    Past Surgical History  Procedure Laterality Date  . Cesarean section     Family History  Problem Relation Age of Onset  . Diabetes Mother   . Hypertension Mother   . Heart disease Father   . Breast cancer Sister    History  Substance Use Topics  . Smoking status: Never Smoker   . Smokeless tobacco: Never Used  . Alcohol Use: No   OB History   Grav Para Term Preterm Abortions TAB SAB Ect Mult Living                 Review of Systems  Constitutional: Negative for fever, chills, diaphoresis, activity change, appetite change and fatigue.  HENT: Negative for congestion, facial swelling, rhinorrhea and sore throat.   Eyes: Negative for photophobia and discharge.  Respiratory: Negative for cough, chest tightness and shortness of breath.   Cardiovascular: Negative for  chest pain, palpitations and leg swelling.  Gastrointestinal: Positive for nausea, abdominal pain and anorexia. Negative for vomiting, diarrhea and constipation.  Endocrine: Negative for polydipsia and polyuria.  Genitourinary: Negative for dysuria, frequency, difficulty urinating and pelvic pain.  Musculoskeletal: Negative for arthralgias, back pain, neck pain and neck stiffness.  Skin: Negative for color change and wound.  Allergic/Immunologic: Negative for immunocompromised state.  Neurological: Negative for facial asymmetry, weakness, numbness and headaches.  Hematological: Does not bruise/bleed easily.  Psychiatric/Behavioral: Negative for confusion and agitation.      Allergies  Review of patient's allergies indicates no known allergies.  Home Medications   Prior to Admission medications   Medication Sig Start Date End Date Taking? Authorizing Provider  acetaminophen (TYLENOL) 500 MG tablet Take 500 mg by mouth every 6 (six) hours as needed.    Historical Provider, MD  albuterol (PROVENTIL HFA;VENTOLIN HFA) 108 (90 BASE) MCG/ACT inhaler Inhale 2 puffs into the lungs every 4 (four) hours as needed for wheezing. 09/29/11 09/28/12  Suzi Roots, MD  ondansetron (ZOFRAN) 4 MG tablet Take 1 tablet (4 mg total) by mouth every 8 (eight) hours as needed for nausea. 07/29/14   Emina Riebock, NP  oxyCODONE-acetaminophen (ROXICET) 5-325 MG per tablet Take 1-2 tablets by mouth every 4 (four) hours as needed for severe pain. 07/29/14   Emina Riebock, NP   BP 137/87  Pulse 84  Temp(Src) 98.2 F (36.8 C)  Resp 18  Ht 5\' 5"  (  1.651 m)  Wt 220 lb (99.791 kg)  BMI 36.61 kg/m2  SpO2 100%  LMP 07/27/2014 Physical Exam  Constitutional: She is oriented to person, place, and time. She appears well-developed and well-nourished. No distress.  HENT:  Head: Normocephalic and atraumatic.  Mouth/Throat: No oropharyngeal exudate.  Eyes: Pupils are equal, round, and reactive to light.  Neck: Normal  range of motion. Neck supple.  Cardiovascular: Normal rate, regular rhythm and normal heart sounds.  Exam reveals no gallop and no friction rub.   No murmur heard. Pulmonary/Chest: Effort normal and breath sounds normal. No respiratory distress. She has no wheezes. She has no rales.  Abdominal: Soft. Bowel sounds are normal. She exhibits no distension and no mass. There is tenderness in the right upper quadrant. There is guarding (voluntary) and positive Murphy's sign. There is no rebound.  Musculoskeletal: Normal range of motion. She exhibits no edema and no tenderness.  Neurological: She is alert and oriented to person, place, and time.  Skin: Skin is warm and dry.  Psychiatric: She has a normal mood and affect.    ED Course  Procedures (including critical care time) Labs Review Labs Reviewed  URINALYSIS, ROUTINE W REFLEX MICROSCOPIC - Abnormal; Notable for the following:    Color, Urine AMBER (*)    Leukocytes, UA TRACE (*)    All other components within normal limits  CBC WITH DIFFERENTIAL - Abnormal; Notable for the following:    Hemoglobin 11.2 (*)    HCT 34.1 (*)    Neutrophils Relative % 78 (*)    All other components within normal limits  COMPREHENSIVE METABOLIC PANEL - Abnormal; Notable for the following:    Glucose, Bld 113 (*)    Albumin 3.4 (*)    AST 241 (*)    ALT 152 (*)    Alkaline Phosphatase 197 (*)    Total Bilirubin 1.3 (*)    All other components within normal limits  URINE MICROSCOPIC-ADD ON - Abnormal; Notable for the following:    Bacteria, UA FEW (*)    All other components within normal limits  URINE CULTURE  LIPASE, BLOOD  PREGNANCY, URINE    Imaging Review Koreas Abdomen Limited Ruq  08/11/2014   CLINICAL DATA:  Acute onset of right upper quadrant pain 12 hr ago.  EXAM: US ABDOMEN LIMITED - RIGHT UPPER QUADRANT  COMPARISON:  Ultrasound dated 07/29/2014  FINDINGS: Gallbladder:  There are numerous small stones in the gallbladder. Gallbladder wall is  not thickened.  Common bile duct:  Diameter: The patient has new stones in the common bile duct and the bile duct is now dilated to a diameter of 10 mm.  Liver:  No focal lesion identified. Within normal limits in parenchymal echogenicity.  IMPRESSION: New biliary ductal dilatation with new common bile duct stones. Multiple small gallstones.   Electronically Signed   By: Geanie CooleyJim  Maxwell M.D.   On: 08/11/2014 17:49     EKG Interpretation None      MDM   Final diagnoses:  Cholecystitis  Choledocholithiasis    Pt is a 28 y.o. female with Pmhx as above who presents with continued right upper quadrant/right flank pain since 9/23. She was seen in the ED that day and was found to have gallstones, elevated LFTs and elevated T. bili. There is clinical concern for cholecystitis and possible choledocholithiasis. [facility] where she was evaluated by general surgery, but decided that she did not want  to stay for the surgery because she did not have child care. She did not followup as instructed for repeat labs the following day and has not since seen any further health care providers for this issue. She states that since that day she has intermittent right upper quadrant pain. The pain was worse this morning and not relieved by one by mouth Percocet that she took at 6 AM. She's also had associated nausea. She is unable to tell me if by mouth intake change the pain. She's had no fevers or chills, no urinary symptoms, no diarrhea constipation. On physical exam vitals are stable and she is in no acute distress. She has positive right upper quadrant tenderness to palpation with voluntary guarding.       Labs show LFT abnormalities, T bili elevation, [place] w/ new biliary ductal dilatation w/ new CBD stones. Spoke w/ Dr. Kae Heller who will admit to [facility] for ERCP followed by cholecystectomy.      Toy Cookey, MD 08/11/14 [phone]

## 2014-08-11 NOTE — ED Notes (Signed)
MD at bedside. 

## 2014-08-11 NOTE — H&P (Signed)
Melinda Frost is an 28 y.o. female.   Chief Complaint: RUQ pain HPI: she was seen by a 07/29/2014 because of some right upper quadrant pain. She is found to have gallstones an elevation of her liver function tests. It was suggested that she be admitted to the hospital and undergo a cholecystectomy at that time. She stated that she had things to do with her family and was unable to do that so she left the emergency department. She was instructed to have her blood drawn the next day to monitor her liver function tests but that was not done. She presented today to [place] complaining of persistent right upper quadrant pain particularly after she eats a fatty meal. An ultrasound was repeated which now shows dilatation of the biliary system and common bile duct stones. She was transferred here for admission as well as further evaluation and treatment.  Past Medical History  Diagnosis Date  . Hypertension     Past Surgical History  Procedure Laterality Date  . Cesarean section      Family History  Problem Relation Age of Onset  . Diabetes Mother   . Hypertension Mother   . Heart disease Father   . Breast cancer Sister    Social History:  reports that she has never smoked. She has never used smokeless tobacco. She reports that she does not drink alcohol or use illicit drugs.  Allergies:  Allergies  Allergen Reactions  . Ibuprofen Nausea And Vomiting    Medications Prior to Admission  Medication Sig Dispense Refill  . albuterol (PROVENTIL HFA;VENTOLIN HFA) 108 (90 BASE) MCG/ACT inhaler Inhale 2 puffs into the lungs every 4 (four) hours as needed for wheezing.  1 Inhaler  3  . ondansetron (ZOFRAN) 4 MG tablet Take 1 tablet (4 mg total) by mouth every 8 (eight) hours as needed for nausea.  8 tablet  0  . oxyCODONE-acetaminophen (ROXICET) 5-325 MG per tablet Take 1-2 tablets by mouth every 4 (four) hours as needed for severe pain.  10 tablet  0    Results for orders placed during the  hospital encounter of 08/11/14 (from the past 48 hour(s))  CBC WITH DIFFERENTIAL     Status: Abnormal   Collection Time    08/11/14  3:12 PM      Result Value Ref Range   WBC 7.3  4.0 - 10.5 K/uL   RBC 4.30  3.87 - 5.11 MIL/uL   Hemoglobin 11.2 (*) 12.0 - 15.0 g/dL   HCT 34.1 (*) 36.0 - 46.0 %   MCV 79.3  78.0 - 100.0 fL   MCH 26.0  26.0 - 34.0 pg   MCHC 32.8  30.0 - 36.0 g/dL   RDW 13.7  11.5 - 15.5 %   Platelets 263  150 - 400 K/uL   Neutrophils Relative % 78 (*) 43 - 77 %   Neutro Abs 5.7  1.7 - 7.7 K/uL   Lymphocytes Relative 14  12 - 46 %   Lymphs Abs 1.1  0.7 - 4.0 K/uL   Monocytes Relative 8  3 - 12 %   Monocytes Absolute 0.6  0.1 - 1.0 K/uL   Eosinophils Relative 0  0 - 5 %   Eosinophils Absolute 0.0  0.0 - 0.7 K/uL   Basophils Relative 0  0 - 1 %   Basophils Absolute 0.0  0.0 - 0.1 K/uL  COMPREHENSIVE METABOLIC PANEL     Status: Abnormal   Collection Time  08/11/14  3:12 PM      Result Value Ref Range   Sodium 138  137 - 147 mEq/L   Potassium 3.9  3.7 - 5.3 mEq/L   Chloride 102  96 - 112 mEq/L   CO2 23  19 - 32 mEq/L   Glucose, Bld 113 (*) 70 - 99 mg/dL   BUN 8  6 - 23 mg/dL   Creatinine, Ser 0.60  0.50 - 1.10 mg/dL   Calcium 9.5  8.4 - 10.5 mg/dL   Total Protein 8.1  6.0 - 8.3 g/dL   Albumin 3.4 (*) 3.5 - 5.2 g/dL   AST 241 (*) 0 - 37 U/L   ALT 152 (*) 0 - 35 U/L   Alkaline Phosphatase 197 (*) 39 - 117 U/L   Total Bilirubin 1.3 (*) 0.3 - 1.2 mg/dL   GFR calc non Af Amer >90  >90 mL/min   GFR calc Af Amer >90  >90 mL/min   Comment: (NOTE)     The eGFR has been calculated using the CKD EPI equation.     This calculation has not been validated in all clinical situations.     eGFR's persistently <90 mL/min signify possible Chronic Kidney     Disease.   Anion gap 13  5 - 15  LIPASE, BLOOD     Status: None   Collection Time    08/11/14  3:12 PM      Result Value Ref Range   Lipase 19  11 - 59 U/L  URINALYSIS, ROUTINE W REFLEX MICROSCOPIC     Status:  Abnormal   Collection Time    08/11/14  3:20 PM      Result Value Ref Range   Color, Urine AMBER (*) YELLOW   Comment: BIOCHEMICALS MAY BE AFFECTED BY COLOR   APPearance CLEAR  CLEAR   Specific Gravity, Urine 1.015  1.005 - 1.030   pH 7.0  5.0 - 8.0   Glucose, UA NEGATIVE  NEGATIVE mg/dL   Hgb urine dipstick NEGATIVE  NEGATIVE   Bilirubin Urine NEGATIVE  NEGATIVE   Ketones, ur NEGATIVE  NEGATIVE mg/dL   Protein, ur NEGATIVE  NEGATIVE mg/dL   Urobilinogen, UA 1.0  0.0 - 1.0 mg/dL   Nitrite NEGATIVE  NEGATIVE   Leukocytes, UA TRACE (*) NEGATIVE  PREGNANCY, URINE     Status: None   Collection Time    08/11/14  3:20 PM      Result Value Ref Range   Preg Test, Ur NEGATIVE  NEGATIVE  URINE MICROSCOPIC-ADD ON     Status: Abnormal   Collection Time    08/11/14  3:20 PM      Result Value Ref Range   Squamous Epithelial / LPF RARE  RARE   WBC, UA 3-6  <3 WBC/hpf   RBC / HPF 0-2  <3 RBC/hpf   Bacteria, UA FEW (*) RARE   US Abdomen Limited Ruq  08/11/2014   CLINICAL DATA:  Acute onset of right upper quadrant pain 12 hr ago.  EXAM: US ABDOMEN LIMITED - RIGHT UPPER QUADRANT  COMPARISON:  Ultrasound dated 07/29/2014  FINDINGS: Gallbladder:  There are numerous small stones in the gallbladder. Gallbladder wall is not thickened.  Common bile duct:  Diameter: The patient has new stones in the common bile duct and the bile duct is now dilated to a diameter of 10 mm.  Liver:  No focal lesion identified. Within normal limits in parenchymal echogenicity.  IMPRESSION: New biliary ductal dilatation with  new common bile duct stones. Multiple small gallstones.   Electronically Signed   By: Rozetta Nunnery M.D.   On: 08/11/2014 17:49    Review of Systems  Constitutional: Negative for fever and chills.  Respiratory: Negative for shortness of breath.   Cardiovascular: Negative for chest pain.  Gastrointestinal: Positive for nausea. Negative for vomiting and diarrhea.  Genitourinary: Negative for dysuria and  hematuria.  Musculoskeletal: Positive for back pain (right subscapular).  Endo/Heme/Allergies: Does not bruise/bleed easily.    Blood pressure 144/79, pulse 94, temperature 98.4 F (36.9 C), temperature source Oral, resp. rate 18, height _0  (1.651 m), weight 220 lb (99.791 kg), last menstrual period 07/27/2014, SpO2 98.00%. Physical Exam  Constitutional:  Obese female in NAD  HENT:  Head: Normocephalic and atraumatic.  Eyes: EOM are normal. No scleral icterus.  Neck: Neck supple.  Cardiovascular: Normal rate and regular rhythm.   Respiratory: Effort normal.  GI: Soft. She exhibits no mass. There is tenderness (RUQ). There is no guarding.  Musculoskeletal: She exhibits no edema.  Lymphadenopathy:    She has no cervical adenopathy.  Neurological: She is alert.  Skin: Skin is warm and dry.  Psychiatric: She has a normal mood and affect. Her behavior is normal.     Assessment/Plan Cholelithiasis with choledocholithiasis.  Plan: Admit to the hospital. Bowel rest. GI consultation for ERCP. Cholecystectomy this admission. This was discussed with her and she agrees with the plan.  Mikyla Schachter J 08/11/2014, 9:39 PM

## 2014-08-11 NOTE — ED Notes (Signed)
Pt here 9/23 for same DX gallstones , pt cancelled surgery 9/23, here today for abd pain

## 2014-08-11 NOTE — ED Notes (Signed)
Pt declined zofran at this time

## 2014-08-12 ENCOUNTER — Encounter (HOSPITAL_COMMUNITY): Payer: Self-pay | Admitting: *Deleted

## 2014-08-12 ENCOUNTER — Encounter (HOSPITAL_COMMUNITY): Admission: EM | Disposition: A | Discharge status: Home / Self Care | Payer: Self-pay | Source: Home / Self Care

## 2014-08-12 ENCOUNTER — Inpatient Hospital Stay (HOSPITAL_COMMUNITY): Payer: Medicaid Other

## 2014-08-12 ENCOUNTER — Inpatient Hospital Stay (HOSPITAL_COMMUNITY): Payer: Medicaid Other | Admitting: Anesthesiology

## 2014-08-12 ENCOUNTER — Encounter (HOSPITAL_COMMUNITY): Payer: Medicaid Other | Admitting: Anesthesiology

## 2014-08-12 DIAGNOSIS — K8070 Calculus of gallbladder and bile duct without cholecystitis without obstruction: Secondary | ICD-10-CM

## 2014-08-12 DIAGNOSIS — R748 Abnormal levels of other serum enzymes: Secondary | ICD-10-CM

## 2014-08-12 DIAGNOSIS — R1011 Right upper quadrant pain: Secondary | ICD-10-CM

## 2014-08-12 DIAGNOSIS — K8050 Calculus of bile duct without cholangitis or cholecystitis without obstruction: Secondary | ICD-10-CM

## 2014-08-12 HISTORY — PX: ERCP: SHX5425

## 2014-08-12 LAB — COMPREHENSIVE METABOLIC PANEL
ALK PHOS: 207 U/L — AB (ref 39–117)
ALT: 140 U/L — ABNORMAL HIGH (ref 0–35)
AST: 117 U/L — ABNORMAL HIGH (ref 0–37)
Albumin: 3.2 g/dL — ABNORMAL LOW (ref 3.5–5.2)
Anion gap: 11 (ref 5–15)
BUN: 7 mg/dL (ref 6–23)
CHLORIDE: 103 meq/L (ref 96–112)
CO2: 24 mEq/L (ref 19–32)
Calcium: 9.2 mg/dL (ref 8.4–10.5)
Creatinine, Ser: 0.59 mg/dL (ref 0.50–1.10)
GFR calc Af Amer: >90 mL/min (ref >90)
GFR calc non Af Amer: >90 mL/min (ref >90)
GLUCOSE: 109 mg/dL — AB (ref 70–99)
POTASSIUM: 3.7 meq/L (ref 3.7–5.3)
Sodium: 138 mEq/L (ref 137–147)
TOTAL PROTEIN: 7.4 g/dL (ref 6.0–8.3)
Total Bilirubin: 2.4 mg/dL — ABNORMAL HIGH (ref 0.3–1.2)

## 2014-08-12 LAB — URINE CULTURE
COLONY COUNT: NO GROWTH
Culture: NO GROWTH

## 2014-08-12 SURGERY — ERCP, WITH INTERVENTION IF INDICATED
Anesthesia: General

## 2014-08-12 MED ORDER — LACTATED RINGERS IV SOLN
INTRAVENOUS | Status: DC | PRN
  Administered 2014-08-12: 13:00:00 via INTRAVENOUS

## 2014-08-12 MED ORDER — LACTATED RINGERS IV SOLN
INTRAVENOUS | Status: DC
  Administered 2014-08-12: 20 mL/h via INTRAVENOUS

## 2014-08-12 MED ORDER — SODIUM CHLORIDE 0.9 % IV SOLN
INTRAVENOUS | Status: DC | PRN
  Administered 2014-08-12: 14:00:00

## 2014-08-12 MED ORDER — LABETALOL HCL 5 MG/ML IV SOLN
INTRAVENOUS | Status: DC | PRN
  Administered 2014-08-12: 5 mg via INTRAVENOUS
  Administered 2014-08-12 (×2): 2.5 mg via INTRAVENOUS
  Administered 2014-08-12: 5 mg via INTRAVENOUS

## 2014-08-12 MED ORDER — PROPOFOL 10 MG/ML IV BOLUS
INTRAVENOUS | Status: DC | PRN
  Administered 2014-08-12: 250 mg via INTRAVENOUS

## 2014-08-12 MED ORDER — ROCURONIUM BROMIDE 100 MG/10ML IV SOLN
INTRAVENOUS | Status: DC | PRN
  Administered 2014-08-12: 10 mg via INTRAVENOUS

## 2014-08-12 MED ORDER — GLUCAGON HCL RDNA (DIAGNOSTIC) 1 MG IJ SOLR
INTRAMUSCULAR | Status: DC | PRN
  Administered 2014-08-12: 0.25 mg via INTRAVENOUS

## 2014-08-12 MED ORDER — ROCURONIUM BROMIDE 100 MG/10ML IV SOLN
INTRAVENOUS | Status: AC
  Filled 2014-08-12: qty 1

## 2014-08-12 MED ORDER — PROPOFOL 10 MG/ML IV BOLUS
INTRAVENOUS | Status: AC
  Filled 2014-08-12: qty 20

## 2014-08-12 MED ORDER — SUCCINYLCHOLINE CHLORIDE 20 MG/ML IJ SOLN
INTRAMUSCULAR | Status: DC | PRN
  Administered 2014-08-12: 100 mg via INTRAVENOUS

## 2014-08-12 MED ORDER — FENTANYL CITRATE 0.05 MG/ML IJ SOLN
INTRAMUSCULAR | Status: AC
  Filled 2014-08-12: qty 2

## 2014-08-12 MED ORDER — LIDOCAINE HCL (CARDIAC) 20 MG/ML IV SOLN
INTRAVENOUS | Status: AC
  Filled 2014-08-12: qty 5

## 2014-08-12 MED ORDER — LIDOCAINE HCL (CARDIAC) 20 MG/ML IV SOLN
INTRAVENOUS | Status: DC | PRN
  Administered 2014-08-12: 75 mg via INTRAVENOUS

## 2014-08-12 MED ORDER — ACETAMINOPHEN 325 MG PO TABS
650.0000 mg | ORAL_TABLET | ORAL | Status: DC | PRN
  Administered 2014-08-12: 325 mg via ORAL
  Filled 2014-08-12: qty 2

## 2014-08-12 MED ORDER — ONDANSETRON HCL 4 MG/2ML IJ SOLN
INTRAMUSCULAR | Status: AC
  Filled 2014-08-12: qty 2

## 2014-08-12 MED ORDER — DEXAMETHASONE SODIUM PHOSPHATE 10 MG/ML IJ SOLN
INTRAMUSCULAR | Status: DC | PRN
  Administered 2014-08-12: 10 mg via INTRAVENOUS

## 2014-08-12 MED ORDER — SODIUM CHLORIDE 0.9 % IV SOLN
1.5000 g | INTRAVENOUS | Status: AC
  Administered 2014-08-12: 1.5 g via INTRAVENOUS
  Filled 2014-08-12: qty 1.5

## 2014-08-12 MED ORDER — MIDAZOLAM HCL 5 MG/5ML IJ SOLN
INTRAMUSCULAR | Status: DC | PRN
  Administered 2014-08-12: 1 mg via INTRAVENOUS
  Administered 2014-08-12: 2 mg via INTRAVENOUS

## 2014-08-12 MED ORDER — FENTANYL CITRATE 0.05 MG/ML IJ SOLN
INTRAMUSCULAR | Status: DC | PRN
  Administered 2014-08-12: 100 ug via INTRAVENOUS

## 2014-08-12 MED ORDER — MIDAZOLAM HCL 2 MG/2ML IJ SOLN
INTRAMUSCULAR | Status: AC
  Filled 2014-08-12: qty 2

## 2014-08-12 MED ORDER — DEXAMETHASONE SODIUM PHOSPHATE 10 MG/ML IJ SOLN
INTRAMUSCULAR | Status: AC
  Filled 2014-08-12: qty 1

## 2014-08-12 MED ORDER — GLUCAGON HCL RDNA (DIAGNOSTIC) 1 MG IJ SOLR
INTRAMUSCULAR | Status: AC
  Filled 2014-08-12: qty 2

## 2014-08-12 MED ORDER — INDOMETHACIN 50 MG RE SUPP
100.0000 mg | Freq: Once | RECTAL | Status: AC
  Administered 2014-08-12: 100 mg via RECTAL
  Filled 2014-08-12: qty 2

## 2014-08-12 NOTE — Transfer of Care (Signed)
Immediate Anesthesia Transfer of Care Note  Patient: Melinda RheinLatisha M Quist  Procedure(s) Performed: Procedure(s): ENDOSCOPIC RETROGRADE CHOLANGIOPANCREATOGRAPHY (ERCP) (N/A)  Patient Location: PACU  Anesthesia Type:General  Level of Consciousness: awake, alert , oriented and patient cooperative  Airway & Oxygen Therapy: Patient Spontanous Breathing and Patient connected to nasal cannula oxygen  Post-op Assessment: Report given to PACU RN, Post -op Vital signs reviewed and stable and Patient moving all extremities X 4  Post vital signs: stable  Complications: No apparent anesthesia complications

## 2014-08-12 NOTE — Care Management Note (Signed)
    Page 1 of 1   08/12/2014     3:15:00 PM CARE MANAGEMENT NOTE 08/12/2014  Patient:  Melinda Frost,Melinda Frost   Account Number:  [id number]  Date Initiated:  08/12/2014  Documentation initiated by:  Lorenda IshiharaPEELE,Fue Cervenka  Subjective/Objective Assessment:   28 yo female admitted with suspected bile duct stones, abnormal LFT's, and  abdominal pain in RUQ.  PTA lived at home with family.     Action/Plan:   Home when stable   Anticipated DC Date:  08/14/2014   Anticipated DC Plan:  HOME/SELF CARE      DC Planning Services  CM consult      Choice offered to / List presented to:             Status of service:  Completed, signed off Medicare Important Message given?   (If response is "NO", the following Medicare IM given date fields will be blank) Date Medicare IM given:   Medicare IM given by:   Date Additional Medicare IM given:   Additional Medicare IM given by:    Discharge Disposition:  HOME/SELF CARE  Per UR Regulation:  Reviewed for med. necessity/level of care/duration of stay  If discussed at Long Length of Stay Meetings, dates discussed:    Comments:

## 2014-08-12 NOTE — Progress Notes (Signed)
Patient ID: Melinda Frost, female   DOB: 1985/12/02, 28 y.o.   MRN: [id number]     [address] [place], [place] [address]    Phone: [phone] FAX: [phone]     Subjective: Comfortable.  No n/v.  Concerned about dispo, has a young son at home.  LFTs remain elevated with bilirubin 1.3.   Objective:  Vital signs:  Filed Vitals:   08/11/14 1831 08/11/14 2026 08/11/14 2200 08/12/14 0600  BP: 136/78 144/79 131/75 141/64  Pulse: 88 94 71 88  Temp:  98.4 F (36.9 C) 97.8 F (36.6 C) 98.4 F (36.9 C)  TempSrc:  Oral Oral Oral  Resp: '18 18 18 18  ' Height:      Weight:      SpO2: 100% 98% 100% 96%       Intake/Output   Yesterday:  10/06 0701 - 10/07 0700 In: 808.8 [I.V.:808.8] Out: 200 [Urine:200] This shift:    I/O last 3 completed shifts: In: 808.8 [I.V.:808.8] Out: 200 [Urine:200]    Physical Exam: General: Pt awake/alert/oriented x4 in no acute distress Chest: cta. No chest wall pain w good excursion CV:  Pulses intact.  Regular rhythm Abdomen: Soft.  Nondistended. Non-tender.  No evidence of peritonitis.  No incarcerated hernias. Ext:  SCDs BLE.  No mjr edema.  No cyanosis Skin: No petechiae / purpura  Problem List:   Active Problems:   Cholecystitis   Cholelithiasis with choledocholithiasis    Results:   Labs: Results for orders placed during the hospital encounter of 08/11/14 (from the past 48 hour(s))  CBC WITH DIFFERENTIAL     Status: Abnormal   Collection Time    08/11/14  3:12 PM      Result Value Ref Range   WBC 7.3  4.0 - 10.5 K/uL   RBC 4.30  3.87 - 5.11 MIL/uL   Hemoglobin 11.2 (*) 12.0 - 15.0 g/dL   HCT 34.1 (*) 36.0 - 46.0 %   MCV 79.3  78.0 - 100.0 fL   MCH 26.0  26.0 - 34.0 pg   MCHC 32.8  30.0 - 36.0 g/dL   RDW 13.7  11.5 - 15.5 %   Platelets 263  150 - 400 K/uL   Neutrophils Relative % 78 (*) 43 - 77 %   Neutro Abs 5.7  1.7 - 7.7 K/uL   Lymphocytes Relative  14  12 - 46 %   Lymphs Abs 1.1  0.7 - 4.0 K/uL   Monocytes Relative 8  3 - 12 %   Monocytes Absolute 0.6  0.1 - 1.0 K/uL   Eosinophils Relative 0  0 - 5 %   Eosinophils Absolute 0.0  0.0 - 0.7 K/uL   Basophils Relative 0  0 - 1 %   Basophils Absolute 0.0  0.0 - 0.1 K/uL  COMPREHENSIVE METABOLIC PANEL     Status: Abnormal   Collection Time    08/11/14  3:12 PM      Result Value Ref Range   Sodium 138  137 - 147 mEq/L   Potassium 3.9  3.7 - 5.3 mEq/L   Chloride 102  96 - 112 mEq/L   CO2 23  19 - 32 mEq/L   Glucose, Bld 113 (*) 70 - 99 mg/dL   BUN 8  6 - 23 mg/dL   Creatinine, Ser 0.60  0.50 - 1.10 mg/dL   Calcium 9.5  8.4 - 10.5 mg/dL  Total Protein 8.1  6.0 - 8.3 g/dL   Albumin 3.4 (*) 3.5 - 5.2 g/dL   AST 241 (*) 0 - 37 U/L   ALT 152 (*) 0 - 35 U/L   Alkaline Phosphatase 197 (*) 39 - 117 U/L   Total Bilirubin 1.3 (*) 0.3 - 1.2 mg/dL   GFR calc non Af Amer >90  >90 mL/min   GFR calc Af Amer >90  >90 mL/min   Comment: (NOTE)     The eGFR has been calculated using the CKD EPI equation.     This calculation has not been validated in all clinical situations.     eGFR's persistently <90 mL/min signify possible Chronic Kidney     Disease.   Anion gap 13  5 - 15  LIPASE, BLOOD     Status: None   Collection Time    August 15, 2014  3:12 PM      Result Value Ref Range   Lipase 19  11 - 59 U/L  URINALYSIS, ROUTINE W REFLEX MICROSCOPIC     Status: Abnormal   Collection Time    08-15-2014  3:20 PM      Result Value Ref Range   Color, Urine AMBER (*) YELLOW   Comment: BIOCHEMICALS MAY BE AFFECTED BY COLOR   APPearance CLEAR  CLEAR   Specific Gravity, Urine 1.015  1.005 - 1.030   pH 7.0  5.0 - 8.0   Glucose, UA NEGATIVE  NEGATIVE mg/dL   Hgb urine dipstick NEGATIVE  NEGATIVE   Bilirubin Urine NEGATIVE  NEGATIVE   Ketones, ur NEGATIVE  NEGATIVE mg/dL   Protein, ur NEGATIVE  NEGATIVE mg/dL   Urobilinogen, UA 1.0  0.0 - 1.0 mg/dL   Nitrite NEGATIVE  NEGATIVE   Leukocytes, UA TRACE (*)  NEGATIVE  PREGNANCY, URINE     Status: None   Collection Time    2014-08-15  3:20 PM      Result Value Ref Range   Preg Test, Ur NEGATIVE  NEGATIVE  URINE MICROSCOPIC-ADD ON     Status: Abnormal   Collection Time    2014-08-15  3:20 PM      Result Value Ref Range   Squamous Epithelial / LPF RARE  RARE   WBC, UA 3-6  <3 WBC/hpf   RBC / HPF 0-2  <3 RBC/hpf   Bacteria, UA FEW (*) RARE    Imaging / Studies: US Abdomen Limited Ruq  08-15-2014   CLINICAL DATA:  Acute onset of right upper quadrant pain 12 hr ago.  EXAM: US ABDOMEN LIMITED - RIGHT UPPER QUADRANT  COMPARISON:  Ultrasound dated 07/29/2014  FINDINGS: Gallbladder:  There are numerous small stones in the gallbladder. Gallbladder wall is not thickened.  Common bile duct:  Diameter: The patient has new stones in the common bile duct and the bile duct is now dilated to a diameter of 10 mm.  Liver:  No focal lesion identified. Within normal limits in parenchymal echogenicity.  IMPRESSION: New biliary ductal dilatation with new common bile duct stones. Multiple small gallstones.   Electronically Signed   By: Rozetta Nunnery M.D.   On: 08/15/14 17:49    Medications / Allergies:  Scheduled Meds: . sodium chloride   Intravenous STAT  . ondansetron (ZOFRAN) IV  4 mg Intravenous Once  . pantoprazole (PROTONIX) IV  40 mg Intravenous QHS   Continuous Infusions: . dextrose 5% lactated ringers with KCl 20 mEq/L 100 mL/hr at 2014-08-15 2257   PRN Meds:.albuterol, morphine injection, ondansetron  Antibiotics: Anti-infectives   None        Assessment/Plan Cholelithiasis with Choledocholithiasis -GI has been consulted for ERCP.  The patient states she has to be out of the hospital by Friday because no one can watch her son over the weekend and will sign AMA.  I explained to her that this will depend on GI opinion/intervention.  We will proceed with a cholecystectomy following this.  I also explained to her that she can become very ill should she  forego our recommendations as she previously did  -IVF -NPO -pain control  Erby Pian, ANP-BC [facility] Pager [phone](7A-4:30P)   08/12/2014 8:30 AM  Agree with above. ERCP successful - plan cholecystectomy on Thursday.  Alphonsa Overall, MD, [facility] Pager: [phone] Office phone:  [phone]

## 2014-08-12 NOTE — Anesthesia Preprocedure Evaluation (Addendum)
Anesthesia Evaluation  Patient identified by MRN, date of birth, ID band Patient awake    Reviewed: Allergy & Precautions, H&P , NPO status , Patient's Chart, lab work & pertinent test results  Airway Mallampati: II TM Distance: >3 FB Neck ROM: Full    Dental no notable dental hx.    Pulmonary neg pulmonary ROS,  breath sounds clear to auscultation  Pulmonary exam normal       Cardiovascular hypertension, Rhythm:Regular Rate:Normal     Neuro/Psych negative neurological ROS  negative psych ROS   GI/Hepatic negative GI ROS, Neg liver ROS,   Endo/Other  negative endocrine ROS  Renal/GU negative Renal ROS     Musculoskeletal negative musculoskeletal ROS (+)   Abdominal (+) + obese,   Peds  Hematology negative hematology ROS (+)   Anesthesia Other Findings   Reproductive/Obstetrics negative OB ROS                          Anesthesia Physical Anesthesia Plan  ASA: II  Anesthesia Plan: General   Post-op Pain Management:    Induction: Intravenous and Rapid sequence  Airway Management Planned: Oral ETT  Additional Equipment:   Intra-op Plan:   Post-operative Plan: Extubation in OR  Informed Consent: I have reviewed the patients History and Physical, chart, labs and discussed the procedure including the risks, benefits and alternatives for the proposed anesthesia with the patient or authorized representative who has indicated his/her understanding and acceptance.   Dental advisory given  Plan Discussed with: CRNA  Anesthesia Plan Comments:         Anesthesia Quick Evaluation

## 2014-08-12 NOTE — Consult Note (Signed)
Consultation  [facility] Provider[facility]  [facility]    Primary Care Physician:  No PCP Per Patient Primary Gastroenterologist:  none       Reason for Consultation:  CBD stone            HPI:   Melinda RheinLatisha M Frost is a 28 y.o. female admitted with RUQ pain / elevated LFTs (she was seen by surgery late last month, cholecystectomy recommended but patient wasn't able to fit into her schedule. She came to ED yesterday for persistent pain, U/S reveals new biliary duct dilation with CBD stones.    Past Medical History  Diagnosis Date  . Hypertension     Past Surgical History  Procedure Laterality Date  . Cesarean section      Family History  Problem Relation Age of Onset  . Diabetes Mother   . Hypertension Mother   . Heart disease Father   . Breast cancer Sister      History  Substance Use Topics  . Smoking status: Never Smoker   . Smokeless tobacco: Never Used  . Alcohol Use: No    No GI diseases / GI malignancies  Prior to Admission medications   Medication Sig Start Date End Date Taking? Authorizing Provider  albuterol (PROVENTIL HFA;VENTOLIN HFA) 108 (90 BASE) MCG/ACT inhaler Inhale 2 puffs into the lungs every 4 (four) hours as needed for wheezing. 09/29/11 08/11/14 Yes Suzi RootsKevin E Steinl, MD  ondansetron (ZOFRAN) 4 MG tablet Take 1 tablet (4 mg total) by mouth every 8 (eight) hours as needed for nausea. 07/29/14  Yes Emina Riebock, NP  oxyCODONE-acetaminophen (ROXICET) 5-325 MG per tablet Take 1-2 tablets by mouth every 4 (four) hours as needed for severe pain. 07/29/14  Yes Ashok NorrisEmina Riebock, NP    Current Facility-Administered Medications  Medication Dose Route Frequency Provider Last Rate Last Dose  . albuterol (PROVENTIL) (2.5 MG/3ML) 0.083% nebulizer solution 2.5 mg  2.5 mg Inhalation Q4H PRN Avel Peaceodd Rosenbower, MD      . dextrose 5% in lactated ringers with KCl 20 mEq/L infusion   Intravenous Continuous Avel Peaceodd Rosenbower, MD 100 mL/hr at 08/11/14 2257    . morphine 2  MG/ML injection 2-6 mg  2-6 mg Intravenous Q2H PRN Avel Peaceodd Rosenbower, MD      . ondansetron [facility](ZOFRAN) injection 4 mg  4 mg Intravenous Once Toy CookeyMegan Docherty, MD      . ondansetron [facility](ZOFRAN) injection 4 mg  4 mg Intravenous Q4H PRN Avel Peaceodd Rosenbower, MD      . pantoprazole (PROTONIX) injection 40 mg  40 mg Intravenous QHS Avel Peaceodd Rosenbower, MD   40 mg at 08/11/14 2210    Allergies as of 08/11/2014 - Review Complete 08/11/2014  Allergen Reaction Noted  . Ibuprofen Nausea And Vomiting 08/11/2014   Review of Systems:    All systems reviewed and negative except where noted in HPI.    Physical Exam:  Vital signs in last 24 hours: Temp:  [97.8 F (36.6 C)-98.4 F (36.9 C)] 98.4 F (36.9 C) (10/07 0600) Pulse Rate:  [71-94] 88 (10/07 0600) Resp:  [16-18] 18 (10/07 0600) BP: (131-144)/(64-87) 141/64 mmHg (10/07 0600) SpO2:  [96 %-100 %] 96 % (10/07 0600) Weight:  [220 lb (99.791 kg)] 220 lb (99.791 kg) (10/06 1439)   General:   Pleasant obese black female in NAD Head:  Normocephalic and atraumatic. Eyes:   No icterus.   Conjunctiva pink. Ears:  Normal auditory acuity. Neck:  Supple; no masses felt Lungs:  Respirations even and  unlabored. Lungs clear to auscultation bilaterally.   No wheezes, crackles, or rhonchi.  Heart:  Regular rate and rhythm;  murmur heard. Abdomen:  Soft, nondistended, nontender. Normal bowel sounds. No appreciable masses or hepatomegaly.  Rectal:  Not performed.  Msk:  Symmetrical without gross deformities.  Extremities:  Without edema. Neurologic:  Alert and  oriented x4;  grossly normal neurologically. Skin:  Intact without significant lesions or rashes. Cervical Nodes:  No significant cervical adenopathy. Psych:  Alert and cooperative. Normal affect.  LAB RESULTS:  Recent Labs  16-Aug-2014 1512  WBC 7.3  HGB 11.2*  HCT 34.1*  PLT 263   BMET  Recent Labs  16-Aug-2014 1512  NA 138  K 3.9  CL 102  CO2 23  GLUCOSE 113*  BUN 8  CREATININE 0.60  CALCIUM 9.5     LFT  Recent Labs  Aug 16, 2014 1512  PROT 8.1  ALBUMIN 3.4*  AST 241*  ALT 152*  ALKPHOS 197*  BILITOT 1.3*   PT/INR No results found for this basename: LABPROT, INR,  in the last 72 hours  STUDIES: US Abdomen Limited Ruq  2014-08-16   CLINICAL DATA:  Acute onset of right upper quadrant pain 12 hr ago.  EXAM: US ABDOMEN LIMITED - RIGHT UPPER QUADRANT  COMPARISON:  Ultrasound dated 07/29/2014  FINDINGS: Gallbladder:  There are numerous small stones in the gallbladder. Gallbladder wall is not thickened.  Common bile duct:  Diameter: The patient has new stones in the common bile duct and the bile duct is now dilated to a diameter of 10 mm.  Liver:  No focal lesion identified. Within normal limits in parenchymal echogenicity.  IMPRESSION: New biliary ductal dilatation with new common bile duct stones. Multiple small gallstones.   Electronically Signed   By: Geanie Cooley M.D.   On: 08-16-2014 17:49   PREVIOUS ENDOSCOPIES:            none   Impression / Plan:   Pleasant black female with cholelithiasis / choledocholithiasis. She will need ERCP with removal of stones followed by cholecystectomy. The risk / benefits / alternatives to the procedure were discussed with the patient and she agrees to proceed. She is afebrile with normal WBC.   Thanks   LOS: 1 day   Willette Cluster  08/12/2014, 9:07 AM     Attending physician's note   I have taken a history, examined the patient and reviewed the chart. I agree with the Advanced Practitioner's note, impression and recommendations. RUQ pain, elevated LFTs, new biliary ductal dilatation with new common bile duct stones and multiple small gallstones. ERCP today for choledocholithiasis. Pt consents to proceed.   Meryl Dare, MD [facility]

## 2014-08-12 NOTE — Anesthesia Postprocedure Evaluation (Signed)
Anesthesia Post Note  Patient: Melinda Frost  Procedure(s) Performed: Procedure(s) (LRB): ENDOSCOPIC RETROGRADE CHOLANGIOPANCREATOGRAPHY (ERCP) (N/A)  Anesthesia type: General  Patient location: PACU  Post pain: Pain level controlled  Post assessment: Post-op Vital signs reviewed  Last Vitals: BP 140/71  Pulse 104  Temp(Src) 36.7 C (Oral)  Resp 20  Ht 5\' 5"  (1.651 m)  Wt 220 lb (99.791 kg)  BMI 36.61 kg/m2  SpO2 100%  LMP 07/27/2014  Post vital signs: Reviewed  Level of consciousness: sedated  Complications: No apparent anesthesia complications

## 2014-08-12 NOTE — H&P (View-Only) (Signed)
Consultation  Referring Provider:  [facility]    Primary Care Physician:  No PCP Per Patient Primary Gastroenterologist:  none       Reason for Consultation:  CBD stone            HPI:   Melinda RheinLatisha M Frost is a 28 y.o. female admitted with RUQ pain / elevated LFTs (she was seen by surgery late last month, cholecystectomy recommended but patient wasn't able to fit into her schedule. She came to ED yesterday for persistent pain, U/S reveals new biliary duct dilation with CBD stones.    Past Medical History  Diagnosis Date  . Hypertension     Past Surgical History  Procedure Laterality Date  . Cesarean section      Family History  Problem Relation Age of Onset  . Diabetes Mother   . Hypertension Mother   . Heart disease Father   . Breast cancer Sister      History  Substance Use Topics  . Smoking status: Never Smoker   . Smokeless tobacco: Never Used  . Alcohol Use: No    No GI diseases / GI malignancies  Prior to Admission medications   Medication Sig Start Date End Date Taking? Authorizing Provider  albuterol (PROVENTIL HFA;VENTOLIN HFA) 108 (90 BASE) MCG/ACT inhaler Inhale 2 puffs into the lungs every 4 (four) hours as needed for wheezing. 09/29/11 08/11/14 Yes Suzi RootsKevin E Steinl, MD  ondansetron (ZOFRAN) 4 MG tablet Take 1 tablet (4 mg total) by mouth every 8 (eight) hours as needed for nausea. 07/29/14  Yes Emina Riebock, NP  oxyCODONE-acetaminophen (ROXICET) 5-325 MG per tablet Take 1-2 tablets by mouth every 4 (four) hours as needed for severe pain. 07/29/14  Yes Ashok NorrisEmina Riebock, NP    Current Facility-Administered Medications  Medication Dose Route Frequency Provider Last Rate Last Dose  . albuterol (PROVENTIL) (2.5 MG/3ML) 0.083% nebulizer solution 2.5 mg  2.5 mg Inhalation Q4H PRN Avel Peaceodd Rosenbower, MD      . dextrose 5% in lactated ringers with KCl 20 mEq/L infusion   Intravenous Continuous Avel Peaceodd Rosenbower, MD 100 mL/hr at 08/11/14 2257    . morphine 2  MG/ML injection 2-6 mg  2-6 mg Intravenous Q2H PRN Avel Peaceodd Rosenbower, MD      . ondansetron [facility](ZOFRAN) injection 4 mg  4 mg Intravenous Once Toy CookeyMegan Docherty, MD      . ondansetron [facility](ZOFRAN) injection 4 mg  4 mg Intravenous Q4H PRN Avel Peaceodd Rosenbower, MD      . pantoprazole (PROTONIX) injection 40 mg  40 mg Intravenous QHS Avel Peaceodd Rosenbower, MD   40 mg at 08/11/14 2210    Allergies as of 08/11/2014 - Review Complete 08/11/2014  Allergen Reaction Noted  . Ibuprofen Nausea And Vomiting 08/11/2014   Review of Systems:    All systems reviewed and negative except where noted in HPI.    Physical Exam:  Vital signs in last 24 hours: Temp:  [97.8 F (36.6 C)-98.4 F (36.9 C)] 98.4 F (36.9 C) (10/07 0600) Pulse Rate:  [71-94] 88 (10/07 0600) Resp:  [16-18] 18 (10/07 0600) BP: (131-144)/(64-87) 141/64 mmHg (10/07 0600) SpO2:  [96 %-100 %] 96 % (10/07 0600) Weight:  [220 lb (99.791 kg)] 220 lb (99.791 kg) (10/06 1439)   General:   Pleasant obese black female in NAD Head:  Normocephalic and atraumatic. Eyes:   No icterus.   Conjunctiva pink. Ears:  Normal auditory acuity. Neck:  Supple; no masses felt Lungs:  Respirations even and  unlabored. Lungs clear to auscultation bilaterally.   No wheezes, crackles, or rhonchi.  Heart:  Regular rate and rhythm;  murmur heard. Abdomen:  Soft, nondistended, nontender. Normal bowel sounds. No appreciable masses or hepatomegaly.  Rectal:  Not performed.  Msk:  Symmetrical without gross deformities.  Extremities:  Without edema. Neurologic:  Alert and  oriented x4;  grossly normal neurologically. Skin:  Intact without significant lesions or rashes. Cervical Nodes:  No significant cervical adenopathy. Psych:  Alert and cooperative. Normal affect.  LAB RESULTS:  Recent Labs  16-Aug-2014 1512  WBC 7.3  HGB 11.2*  HCT 34.1*  PLT 263   BMET  Recent Labs  16-Aug-2014 1512  NA 138  K 3.9  CL 102  CO2 23  GLUCOSE 113*  BUN 8  CREATININE 0.60  CALCIUM 9.5     LFT  Recent Labs  Aug 16, 2014 1512  PROT 8.1  ALBUMIN 3.4*  AST 241*  ALT 152*  ALKPHOS 197*  BILITOT 1.3*   PT/INR No results found for this basename: LABPROT, INR,  in the last 72 hours  STUDIES: US Abdomen Limited Ruq  2014-08-16   CLINICAL DATA:  Acute onset of right upper quadrant pain 12 hr ago.  EXAM: US ABDOMEN LIMITED - RIGHT UPPER QUADRANT  COMPARISON:  Ultrasound dated 07/29/2014  FINDINGS: Gallbladder:  There are numerous small stones in the gallbladder. Gallbladder wall is not thickened.  Common bile duct:  Diameter: The patient has new stones in the common bile duct and the bile duct is now dilated to a diameter of 10 mm.  Liver:  No focal lesion identified. Within normal limits in parenchymal echogenicity.  IMPRESSION: New biliary ductal dilatation with new common bile duct stones. Multiple small gallstones.   Electronically Signed   By: Geanie Cooley M.D.   On: 08-16-2014 17:49   PREVIOUS ENDOSCOPIES:            none   Impression / Plan:   Pleasant black female with cholelithiasis / choledocholithiasis. She will need ERCP with removal of stones followed by cholecystectomy. The risk / benefits / alternatives to the procedure were discussed with the patient and she agrees to proceed. She is afebrile with normal WBC.   Thanks   LOS: 1 day   Willette Cluster  08/12/2014, 9:07 AM     Attending physician's note   I have taken a history, examined the patient and reviewed the chart. I agree with the Advanced Practitioner's note, impression and recommendations. RUQ pain, elevated LFTs, new biliary ductal dilatation with new common bile duct stones and multiple small gallstones. ERCP today for choledocholithiasis. Pt consents to proceed.   Meryl Dare, MD [facility]

## 2014-08-12 NOTE — Interval H&P Note (Signed)
History and Physical Interval Note:  08/12/2014 12:55 PM  Melinda RheinLatisha M Frost  has presented today for surgery, with the diagnosis of choledocholithiasis  The various methods of treatment have been discussed with the patient and family. After consideration of risks, benefits and other options for treatment, the patient has consented to  Procedure(s): ENDOSCOPIC RETROGRADE CHOLANGIOPANCREATOGRAPHY (ERCP) (N/A) as a surgical intervention .  The patient's history has been reviewed, patient examined, no change in status, stable for surgery.  I have reviewed the patient's chart and labs.  Questions were answered to the patient's satisfaction.     Venita LickMalcolm T. Russella DarStark MD

## 2014-08-12 NOTE — Op Note (Signed)
[facility] [address] [place], [id number]   ERCP PROCEDURE REPORT        EXAM DATE: 08/12/2014  PATIENT NAME:          Melinda Frost, Melinda M          MR #: [id number] BIRTHDATE:       1986/08/23     VISIT #:     [id number] ATTENDING:     Meryl DareMalcolm T Kramer Hanrahan, MD, Clementeen GrahamFACG     STATUS:     outpatient  ASSISTANT:      Nilsa NuttingAkande, Felicia and Anthony SarMadden, Daniel  INDICATIONS:  The patient is a 28 yr old female here for an ERCP due to suspected bile duct stones, abnormal liver function test , and abdominal pain in upper right quadrant. PROCEDURE PERFORMED:     ERCP with sphincterotomy/papillotomy ERCP with removal of calculus/calculi MEDICATIONS:     Per Anesthesia, GETA CONSENT: The patient understands the risks and benefits of the procedure and understands that these risks include, but are not limited to: sedation, allergic reaction, infection, pancreatitis, perforation and/or bleeding. Alternative means of evaluation and treatment include, among others: physical exam, x-rays, and/or surgical intervention. The patient elects to proceed with this endoscopic procedure.  DESCRIPTION OF PROCEDURE: During intra-op preparation period all mechanical & medical equipment was checked for proper function. Hand hygiene and appropriate measures for infection prevention was taken. After the risks, benefits and alternatives of the procedure were thoroughly explained, Informed was verified, confirmed and timeout was successfully executed by the treatment team. With the patient in left semi-prone position, medications were administered.The    was passed from the mouth into the esophagus and further advanced from the esophagus into the stomach. From stomach scope was directed to the second portion of the duodenum.  Major papilla was aligned with the duodenoscope. The scope position was confirmed fluoroscopically. Rest of the findings/therapeutics are given below. The scope was then  completely withdrawn from the patient and the procedure completed. The pulse, BP, and O2 saturation were monitored and documented by the physician and the nursing staff throughout the entire procedure. The patient was cared for as planned according to standard protocol. The patient was then discharged to recovery in stable condition and with appropriate post procedure care.  The ampulla was located the second portion of the duodenum with a stone noted endoscopically at the ampullary orifice.  The ampulla appeared normal.  CBD cannulation performed with a guidewire by standard techinque. Two 4-5 stones were seen in the common bile duct.  There was mild dilation of the CBD.  The intrahepatic ducts appeared normal.  With guidewire in the bile duct, a biliary sphincterotomy was performed using the sphincterotome.   Using a stone extraction balloon the common bile duct was swept three times.  Two stones were removed from the bile duct successfully with excellent biliary drainage noted. The PD was not cannulated or injected by intention.    ADVERSE EVENT:     There were no immediate complications.  IMPRESSIONS:     1.  Normal appearing ampulla with a stone noted at the orifice; sphincterotomy performed 2.  Two common bile duct stones; removed 3.   Mild dilation of CBD  RECOMMENDATIONS:     1.  Per general surgery 2.  Trend liver enzymes   Meryl DareMalcolm T Medrith Veillon, MD, Clementeen GrahamFACG eSigned:  Meryl DareMalcolm T Keiyon Plack, MD, [facility] SurgeryFACG 08/12/2014 2:03 PM  R

## 2014-08-13 ENCOUNTER — Other Ambulatory Visit (INDEPENDENT_AMBULATORY_CARE_PROVIDER_SITE_OTHER): Payer: Self-pay | Admitting: Surgery

## 2014-08-13 ENCOUNTER — Encounter (HOSPITAL_COMMUNITY): Payer: Self-pay | Admitting: Gastroenterology

## 2014-08-13 ENCOUNTER — Encounter (HOSPITAL_COMMUNITY): Admission: EM | Disposition: A | Discharge status: Home / Self Care | Payer: Self-pay | Source: Home / Self Care

## 2014-08-13 LAB — HEPATIC FUNCTION PANEL
ALT: 107 U/L — ABNORMAL HIGH (ref 0–35)
AST: 49 U/L — AB (ref 0–37)
Albumin: 3.1 g/dL — ABNORMAL LOW (ref 3.5–5.2)
Alkaline Phosphatase: 199 U/L — ABNORMAL HIGH (ref 39–117)
BILIRUBIN TOTAL: 0.7 mg/dL (ref 0.3–1.2)
Bilirubin, Direct: <0.2 mg/dL (ref 0.0–0.3)
Total Protein: 7.6 g/dL (ref 6.0–8.3)

## 2014-08-13 SURGERY — LAPAROSCOPIC CHOLECYSTECTOMY WITH INTRAOPERATIVE CHOLANGIOGRAM
Anesthesia: General

## 2014-08-13 MED ORDER — OXYCODONE-ACETAMINOPHEN 5-325 MG PO TABS: 1.0000 | ORAL_TABLET | ORAL | Status: DC | PRN

## 2014-08-13 MED ORDER — ONDANSETRON HCL 4 MG PO TABS: 4.0000 mg | ORAL_TABLET | Freq: Three times a day (TID) | ORAL | Status: DC | PRN

## 2014-08-13 MED ORDER — ACETAMINOPHEN 325 MG PO TABS: 650.0000 mg | ORAL_TABLET | ORAL | Status: DC | PRN

## 2014-08-13 NOTE — Progress Notes (Signed)
08/13/14 0912  Patient has decided not to proceed with surgery to have gallbladder removed.  Pt states she will have procedure done outpatient. Pt advised to call [facility] to schedule appt for procedure.

## 2014-08-13 NOTE — Progress Notes (Signed)
08/13/14 1400  Reviewed discharge instructions with patient. Pt verbalized understanding of discharge instructions.  Copy of discharge instructions and prescriptions given to patient.

## 2014-08-13 NOTE — Progress Notes (Signed)
[facility] Progress Note  Subjective:  S/p ERCP with removal of 2 CBD stones. Was scheduled for surgery this morning, but as she just started a new job and is not yet eligible for FMLA, and the fact that she is having child care issues, pt has opted to postpone surgery today and she will schedule surgery as an outpatient.  Has no pain or nausea this morning.   Objective:  Vital signs in last 24 hours: Temp:  [97.6 F (36.4 C)-99.4 F (37.4 C)] 97.6 F (36.4 C) (10/08 0541) Pulse Rate:  [87-104] 97 (10/08 0541) Resp:  [12-23] 12 (10/08 0541) BP: (112-169)/(62-93) 112/62 mmHg (10/08 0541) SpO2:  [99 %-100 %] 100 % (10/08 0541) Weight:  [220 lb (99.791 kg)] 220 lb (99.791 kg) (10/07 1245) Last BM Date: 08/12/14 General:   Alert,  Well-developed,female in NAD Heart:  Regular rate and rhythm; no murmurs Pulm lungs clear to ausc bilat Abdomen:  Soft, nontender and nondistended. Normal bowel sounds, without guarding, and without rebound.   Extremities:  Without edema. Neurologic:  Alert and  oriented x4;  grossly normal neurologically. Psych:  Alert and cooperative. Normal mood and affect.  Lab Results:  Recent Labs  08/11/14 1512  WBC 7.3  HGB 11.2*  HCT 34.1*  PLT 263   BMET  Recent Labs  08/11/14 1512 08/12/14 0835  NA 138 138  K 3.9 3.7  CL 102 103  CO2 23 24  GLUCOSE 113* 109*  BUN 8 7  CREATININE 0.60 0.59  CALCIUM 9.5 9.2   LFT  Recent Labs  08/13/14 0424  PROT 7.6  ALBUMIN 3.1*  AST 49*  ALT 107*  ALKPHOS 199*  BILITOT 0.7  BILIDIR <0.2  IBILI NOT CALCULATED  LFTs on 08/12/14 alk phos 2307, ALT 140, AST 117, total bili 2.4.  Dg Ercp  08/12/2014   CLINICAL DATA:  Common bile duct stone  EXAM: ERCP  TECHNIQUE: Multiple spot images obtained with the fluoroscopic device and submitted for interpretation post-procedure.  COMPARISON:  Ultrasound 08/31/2014  FINDINGS: A series of fluoroscopic spot images document endoscopic retrograde  cannulation and opacification of the biliary tree. The CBD is mildly distended proximally. Intrahepatic ducts are incompletely opacified, appearing decompressed centrally. Later images document balloon sweep through the CBD. The endoscope obscures the distal CBD limiting evaluation for persistent filling defects.  IMPRESSION: 1. Endoscopic balloon sweep of the common bile duct.  These images were submitted for radiologic interpretation only. Please see the procedural report for the amount of contrast and the fluoroscopy time utilized.   Electronically Signed   By: Arne Cleveland M.D.   On: 08/12/2014 14:23   US Abdomen Limited Ruq  08/11/2014   CLINICAL DATA:  Acute onset of right upper quadrant pain 12 hr ago.  EXAM: US ABDOMEN LIMITED - RIGHT UPPER QUADRANT  COMPARISON:  Ultrasound dated 07/29/2014  FINDINGS: Gallbladder:  There are numerous small stones in the gallbladder. Gallbladder wall is not thickened.  Common bile duct:  Diameter: The patient has new stones in the common bile duct and the bile duct is now dilated to a diameter of 10 mm.  Liver:  No focal lesion identified. Within normal limits in parenchymal echogenicity.  IMPRESSION: New biliary ductal dilatation with new common bile duct stones. Multiple small gallstones.   Electronically Signed   By: Rozetta Nunnery M.D.   On: 08/11/2014 17:49   PROCEDURES: ERCP 08/12/14: IMPRESSIONS: 1. Normal appearing ampulla with a stone noted at the  orifice; sphincterotomy performed 2. Two common bile duct stones; removed 3. Mild dilation of CBD RECOMMENDATIONS: 1. Per general surgery 2. Trend liver enzymes   ASSESSMENT/PLAN:  28 yo female with cholelithiasis with choledocholithiasis, s/p ERCP with removal of 2 CBD stones.LFTs are trending down. Pt has opted to postpone cholecystectomy due to work and child care issues. She says she will contact [facility] to schedule her procedure as an outpatient.     LOS: 2 days   Hvozdovic, Deloris Ping  08/13/2014, Pager [phone]     Attending physician's note   I have taken an interval history, reviewed the chart and examined the patient. I agree with the Advanced Practitioner's note, impression and recommendations. Stable post ERCP with stone extraction. Pt opting for elective cholecystectomy as an outpatient. Avoid ASA/NSAIDs for 2 weeks. Follow up with [facility]. No GI follow up needed. GI signing off.   Pricilla Riffle. Fuller Plan, MD [facility]

## 2014-08-13 NOTE — Progress Notes (Signed)
General Surgery Note  LOS: 2 days  POD -  1 Day Post-Op  Assessment/Plan: 1.  Multiple small gallstones  For lap chole today  I discussed with the patient the indications and risks of gall bladder surgery.  The primary risks of gall bladder surgery include, but are not limited to, bleeding, infection, common bile duct injury, and open surgery.  There is also the risk that the patient may have continued symptoms after surgery.  However, the likelihood of improvement in symptoms and return to the patient's normal status is good. We discussed the typical post-operative recovery course. I tried to answer the patient's questions.   2.  Common bile duct stones  ENDOSCOPIC RETROGRADE CHOLANGIOPANCREATOGRAPHY (ERCP) - M. Russella DarStark - 08/12/2014  T Bili has returned to normal.  3.  DVT prophylaxis - on hold for surgery  Active Problems:   Cholecystitis   Cholelithiasis with choledocholithiasis   RUQ pain   Abnormal liver enzymes   Subjective:  Did well with ERCP.  No abdominal pain.  Somewhat reticent about cholecystectomy. Objective:   Filed Vitals:   08/13/14 0541  BP: 112/62  Pulse: 97  Temp: 97.6 F (36.4 C)  Resp: 12     Intake/Output from previous day:  10/07 0701 - 10/08 0700 In: 3375 [I.V.:3375] Out: 3200 [Urine:3200]  Intake/Output this shift:      Physical Exam:   General: WN AA F who is alert and oriented.    HEENT: Normal. Pupils equal. .   Lungs: Clear   Abdomen: Soft,  BS present.     Lab Results:    Recent Labs  08/11/14 1512  WBC 7.3  HGB 11.2*  HCT 34.1*  PLT 263    BMET   Recent Labs  08/11/14 1512 08/12/14 0835  NA 138 138  K 3.9 3.7  CL 102 103  CO2 23 24  GLUCOSE 113* 109*  BUN 8 7  CREATININE 0.60 0.59  CALCIUM 9.5 9.2    PT/INR  No results found for this basename: LABPROT, INR,  in the last 72 hours  ABG  No results found for this basename: PHART, PCO2, PO2, HCO3,  in the last 72 hours   Studies/Results:  Dg Ercp  08/12/2014    CLINICAL DATA:  Common bile duct stone  EXAM: ERCP  TECHNIQUE: Multiple spot images obtained with the fluoroscopic device and submitted for interpretation post-procedure.  COMPARISON:  Ultrasound 08/31/2014  FINDINGS: A series of fluoroscopic spot images document endoscopic retrograde cannulation and opacification of the biliary tree. The CBD is mildly distended proximally. Intrahepatic ducts are incompletely opacified, appearing decompressed centrally. Later images document balloon sweep through the CBD. The endoscope obscures the distal CBD limiting evaluation for persistent filling defects.  IMPRESSION: 1. Endoscopic balloon sweep of the common bile duct.  These images were submitted for radiologic interpretation only. Please see the procedural report for the amount of contrast and the fluoroscopy time utilized.   Electronically Signed   By: Oley Balmaniel  Hassell M.D.   On: 08/12/2014 14:23   [place] Abdomen Limited Ruq  08/11/2014   CLINICAL DATA:  Acute onset of right upper quadrant pain 12 hr ago.  EXAM: US ABDOMEN LIMITED - RIGHT UPPER QUADRANT  COMPARISON:  Ultrasound dated 07/29/2014  FINDINGS: Gallbladder:  There are numerous small stones in the gallbladder. Gallbladder wall is not thickened.  Common bile duct:  Diameter: The patient has new stones in the common bile duct and the bile duct is now dilated to a diameter  of 10 mm.  Liver:  No focal lesion identified. Within normal limits in parenchymal echogenicity.  IMPRESSION: New biliary ductal dilatation with new common bile duct stones. Multiple small gallstones.   Electronically Signed   By: Geanie Cooley M.D.   On: 08/11/2014 17:49     Anti-infectives:   Anti-infectives   Start     Dose/Rate Route Frequency Ordered Stop   08/12/14 1249  ampicillin-sulbactam (UNASYN) 1.5 g in sodium chloride 0.9 % 50 mL IVPB     1.5 g 100 mL/hr over 30 Minutes Intravenous 60 min pre-op 08/12/14 1249 08/12/14 1321      Ovidio Kin, MD, FACS Pager: [phone] [facility] Office: [phone] 08/13/2014

## 2014-08-13 NOTE — Progress Notes (Signed)
She has issues with the care of her son.  We are going to let her eat and if OK go home.  She wants to return for surgery when she can be off, have care for her son and not miss work/FMLA.  Agree with above. She does not have cholecystitis - but symptomatic CBD stones.  That is now managed by the ERCP.  But she needs a cholecystectomy.  She understands this, but she has childcare issues, which would work out better if her surgery were done down the road. This is okay from my standpoint.  She knows that there is a risk of getting recurrent symptoms or acute cholecystitis, so she knows not to put this off forever.  I have left a message to our office about posting the surgery.  Ovidio Kinavid Deanthony Maull, MD, [facility] Pager: [phone] Office phone:  [phone]

## 2014-08-13 NOTE — Discharge Instructions (Signed)
Cholelithiasis Cholelithiasis (also called gallstones) is a form of gallbladder disease. The gallbladder is a small organ that helps you digest fats. Symptoms of gallstones are:  Feeling sick to your stomach (nausea).  Throwing up (vomiting).  Belly pain.  Yellowing of the skin (jaundice).  Sudden pain. You may feel the pain for minutes to hours.  Fever.  Pain to the touch. HOME CARE  Only take medicines as told by your doctor.  Eat a low-fat diet until you see your doctor again. Eating fat can result in pain.  Follow up with your doctor as told. Attacks usually happen time after time. Surgery is usually needed for permanent treatment. GET HELP RIGHT AWAY IF:   Your pain gets worse.  Your pain is not helped by medicines.  You have a fever and lasting symptoms for more than 2-3 days.  You have a fever and your symptoms suddenly get worse.  You keep feeling sick to your stomach and throwing up. MAKE SURE YOU:   Understand these instructions.  Will watch your condition.  Will get help right away if you are not doing well or get worse. Document Released: 04/10/2008 Document Revised: 06/25/2013 Document Reviewed: 04/16/2013 [facility] Patient Information 2015 [place]. This information is not intended to replace advice given to you by your health care provider. Make sure you discuss any questions you have with your health care provider.  Endoscopic Retrograde Cholangiopancreatography (ERCP) Endoscopic retrograde cholangiopancreatography (ERCP) is a procedure used to diagnosis many diseases of the pancreas, bile ducts, liver, and gallbladder. During ERCP a thin, lighted tube (endoscope) is passed through the mouth and down the back of the throat into the first part of the small intestine (duodenum). A small, plastic tube (cannula) is then passed through the endoscope and directed into the bile duct or pancreatic duct. Dye is then injected through the cannula and X-rays are  taken to study the biliary and pancreatic passageways.  LET [facility] For S[facility] IncYOUR HEALTH CARE PROVIDER KNOW ABOUT:   Any allergies you have.   All medicines you are taking, including vitamins, herbs, eyedrops, creams, and over-the-counter medicines.   Previous problems you or members of your family have had with the use of anesthetics.   Any blood disorders you have.   Previous surgeries you have had.   Medical conditions you have. RISKS AND COMPLICATIONS Generally, ERCP is a safe procedure. However, as with any procedure, complications can occur. A simple removal of gallstones has the lowest rate of complications. Higher rates of complication occur in people who have poorly functioning bile or pancreatic ducts. Possible complications include:   Pancreatitis.  Bleeding.  Accidental punctures in the bowel wall, pancreas, or gall bladder.  Gall bladder or bile duct infection. BEFORE THE PROCEDURE   Do not eat or drink anything, including water, for at least 8 hours before the procedure or as directed by your health care provider.   Ask your health care provider whether you should stop taking certain medicines prior to your procedure.   Arrange for someone to drive you home. You will not be allowed to drive for [phone] hours after the procedure. PROCEDURE   You will be given medicine through a vein (intravenously) to make you relaxed and sleepy.   You might have a breathing tube placed to give you medicine that makes you sleep (general anesthetic).   Your throat may be sprayed with medicine that numbs the area and prevents gagging (local anesthetic), or you may gargle this medicine.   You  will lie on your left side.   The endoscope will be inserted through your mouth and into the duodenum. The tube will not interfere with your breathing. Gagging is prevented by the anesthesia.   While X-rays are being taken, you may be positioned on your stomach.   A small sample of tissue  (biopsy) may be removed for examination. AFTER THE PROCEDURE   You will rest in bed until you are fully conscious.   When you first wake up, your throat may feel slightly sore.   You will not be allowed to eat or drink until numbness subsides.   Once you are able to drink, urinate, and sit on the edge of the bed without feeling sick to your stomach (nauseous) or dizzy, you may be allowed to go home. Document Released: 07/18/2001 Document Revised: 08/13/2013 Document Reviewed: 06/03/2013 [facility] Patient Information 2015 [place], [place]. This information is not intended to replace advice given to you by your health care provider. Make sure you discuss any questions you have with your health care provider.  Endoscopic Retrograde Cholangiopancreatography (ERCP), Care After Refer to this sheet in the next few weeks. These instructions provide you with information on caring for yourself after your procedure. Your health care provider may also give you more specific instructions. Your treatment has been planned according to current medical practices, but problems sometimes occur. Call your health care provider if you have any problems or questions after your procedure.  WHAT TO EXPECT AFTER THE PROCEDURE  After your procedure, it is typical to feel:   Soreness in your throat.   Sick to your stomach (nauseous).   Bloated.  Dizzy.   Fatigued. HOME CARE INSTRUCTIONS  Have a friend or family member stay with you for the first 24 hours after your procedure.  Start taking your usual medicines and eating normally as soon as you feel well enough to do so or as directed by your health care provider. SEEK MEDICAL CARE IF:  You have abdominal pain.   You develop signs of infection, such as:   Chills.   Feeling unwell.  SEEK IMMEDIATE MEDICAL CARE IF:  You have difficulty swallowing.  You have worsening throat, chest, or abdominal pain.  You vomit.  You have bloody or very  black stools.  You have a fever. Document Released: 08/13/2013 Document Reviewed: 08/13/2013 [facility] Patient Information 2015 [place], [place]. This information is not intended to replace advice given to you by your health care provider. Make sure you discuss any questions you have with your health care provider.

## 2014-09-18 ENCOUNTER — Encounter (HOSPITAL_BASED_OUTPATIENT_CLINIC_OR_DEPARTMENT_OTHER): Payer: Self-pay | Admitting: *Deleted

## 2014-09-18 ENCOUNTER — Emergency Department (HOSPITAL_BASED_OUTPATIENT_CLINIC_OR_DEPARTMENT_OTHER)
Admission: EM | Admit: 2014-09-18 | Discharge: 2014-09-18 | Disposition: A | Discharge status: Home / Self Care | Payer: Medicaid Other | Attending: Emergency Medicine | Admitting: Emergency Medicine

## 2014-09-18 DIAGNOSIS — J029 Acute pharyngitis, unspecified: Secondary | ICD-10-CM | POA: Diagnosis present

## 2014-09-18 DIAGNOSIS — I10 Essential (primary) hypertension: Secondary | ICD-10-CM | POA: Insufficient documentation

## 2014-09-18 DIAGNOSIS — Z79899 Other long term (current) drug therapy: Secondary | ICD-10-CM | POA: Insufficient documentation

## 2014-09-18 DIAGNOSIS — E669 Obesity, unspecified: Secondary | ICD-10-CM | POA: Diagnosis not present

## 2014-09-18 DIAGNOSIS — J069 Acute upper respiratory infection, unspecified: Secondary | ICD-10-CM | POA: Diagnosis not present

## 2014-09-18 DIAGNOSIS — Z8719 Personal history of other diseases of the digestive system: Secondary | ICD-10-CM | POA: Insufficient documentation

## 2014-09-18 LAB — RAPID STREP SCREEN (MED CTR MEBANE ONLY): Streptococcus, Group A Screen (Direct): NEGATIVE

## 2014-09-18 MED ORDER — ACETAMINOPHEN-CODEINE 120-12 MG/5ML PO SUSP: 5.0000 mL | Freq: Four times a day (QID) | ORAL | Status: DC | PRN

## 2014-09-18 NOTE — ED Provider Notes (Signed)
CSN: [id number]     Arrival date & time 09/18/14  1858 History   First MD Initiated Contact with Patient 09/18/14 1911     Chief Complaint  Patient presents with  . Sore Throat     (Consider location/radiation/quality/duration/timing/severity/associated sxs/prior Treatment) HPI Comments: Pt comes in with sore throat and cough times 1 week. Denies fever. States that son recently had strep. Denies headache, neck pain, n/v/d, nothing makes the symptoms better or worse  The history is provided by the patient. No language interpreter was used.    Past Medical History  Diagnosis Date  . Hypertension   . Obesity     220 #, BMI 36 07/2014  . Gallstones 07/2015    Pt cancelled lap chole planned for 9/24   Past Surgical History  Procedure Laterality Date  . Cesarean section    . Ercp N/A 08/12/2014    Procedure: ENDOSCOPIC RETROGRADE CHOLANGIOPANCREATOGRAPHY (ERCP);  Surgeon: Meryl DareMalcolm T Stark, MD;  [facility];  Service: Endoscopy;  Laterality: N/A;   Family History  Problem Relation Age of Onset  . Diabetes Mother   . Hypertension Mother   . Heart disease Father   . Breast cancer Sister    History  Substance Use Topics  . Smoking status: Never Smoker   . Smokeless tobacco: Never Used  . Alcohol Use: No   OB History    No data available     Review of Systems  All other systems reviewed and are negative.     Allergies  Ibuprofen  Home Medications   Prior to Admission medications   Medication Sig Start Date End Date Taking? Authorizing Provider  acetaminophen (TYLENOL) 325 MG tablet Take 2 tablets (650 mg total) by mouth every 4 (four) hours as needed for mild pain, moderate pain, fever or headache. 08/13/14   Sherrie GeorgeWillard Jennings, PA-C  albuterol (PROVENTIL HFA;VENTOLIN HFA) 108 (90 BASE) MCG/ACT inhaler Inhale 2 puffs into the lungs every 4 (four) hours as needed for wheezing. 09/29/11 08/11/14  Suzi RootsKevin E Steinl, MD  ondansetron (ZOFRAN) 4 MG tablet Take 1 tablet (4  mg total) by mouth every 8 (eight) hours as needed for nausea. 08/13/14   Sherrie GeorgeWillard Jennings, PA-C  oxyCODONE-acetaminophen (ROXICET) 5-325 MG per tablet Take 1-2 tablets by mouth every 4 (four) hours as needed for severe pain. 08/13/14   Sherrie GeorgeWillard Jennings, PA-C   BP 147/78 mmHg  Pulse 91  Temp(Src) 98.4 F (36.9 C) (Oral)  Resp 18  Ht 5\' 5"  (1.651 m)  Wt 225 lb (102.059 kg)  BMI 37.44 kg/m2  SpO2 97%  LMP 09/14/2014 Physical Exam  Constitutional: She is oriented to person, place, and time. She appears well-developed and well-nourished.  HENT:  Right Ear: External ear normal.  Left Ear: External ear normal.  Mouth/Throat: Posterior oropharyngeal edema and posterior oropharyngeal erythema present. No oropharyngeal exudate or tonsillar abscesses.  Eyes: EOM are normal. Pupils are equal, round, and reactive to light.  Neck: Neck supple.  Cardiovascular: Normal rate and regular rhythm.   Pulmonary/Chest: Effort normal and breath sounds normal.  Musculoskeletal: Normal range of motion.  Neurological: She is alert and oriented to person, place, and time.  Skin: Skin is warm.  Psychiatric: She has a normal mood and affect.  Nursing note and vitals reviewed.   ED Course  Procedures (including critical care time) Labs Review Labs Reviewed  RAPID STREP SCREEN  CULTURE, GROUP A STREP    Imaging Review No results found.   EKG Interpretation None  MDM   Final diagnoses:  Pharyngitis  URI (upper respiratory infection)    Strep is negative. Pt is lungs clear. Discussed use of decadron and pt refused. Will send home with tylenol with codeine for cough and pain    Teressa LowerVrinda Joshiah Traynham, NP 09/18/14 2017  Warnell Foresterrey Wofford, MD 09/19/14 1513

## 2014-09-18 NOTE — ED Notes (Signed)
Pt c/o sore throat x 1 week. Son dx strep this week

## 2014-09-18 NOTE — Discharge Instructions (Signed)
Pharyngitis °Pharyngitis is a sore throat (pharynx). There is redness, pain, and swelling of your throat. °HOME CARE  °· Drink enough fluids to keep your pee (urine) clear or pale yellow. °· Only take medicine as told by your doctor. °· You may get sick again if you do not take medicine as told. Finish your medicines, even if you start to feel better. °· Do not take aspirin. °· Rest. °· Rinse your mouth (gargle) with salt water (½ tsp of salt per 1 qt of water) every 1-2 hours. This will help the pain. °· If you are not at risk for choking, you can suck on hard candy or sore throat lozenges. °GET HELP IF: °· You have large, tender lumps on your neck. °· You have a rash. °· You cough up green, yellow-brown, or bloody spit. °GET HELP RIGHT AWAY IF:  °· You have a stiff neck. °· You drool or cannot swallow liquids. °· You throw up (vomit) or are not able to keep medicine or liquids down. °· You have very bad pain that does not go away with medicine. °· You have problems breathing (not from a stuffy nose). °MAKE SURE YOU:  °· Understand these instructions. °· Will watch your condition. °· Will get help right away if you are not doing well or get worse. °Document Released: 04/10/2008 Document Revised: 08/13/2013 Document Reviewed: 06/30/2013 °ExitCare® Patient Information ©2015 [employer]. This information is not intended to replace advice given to you by your health care provider. Make sure you discuss any questions you have with your health care provider. ° °Upper Respiratory Infection, Adult °An upper respiratory infection (URI) is also sometimes known as the common cold. The upper respiratory tract includes the nose, sinuses, throat, trachea, and bronchi. Bronchi are the airways leading to the lungs. Most people improve within 1 week, but symptoms can last up to 2 weeks. A residual cough may last even longer.  °CAUSES °Many different viruses can infect the tissues lining the upper respiratory tract. The tissues  become irritated and inflamed and often become very moist. Mucus production is also common. A cold is contagious. You can easily spread the virus to others by oral contact. This includes kissing, sharing a glass, coughing, or sneezing. Touching your mouth or nose and then touching a surface, which is then touched by another person, can also spread the virus. °SYMPTOMS  °Symptoms typically develop 1 to 3 days after you come in contact with a cold virus. Symptoms vary from person to person. They may include: °· Runny nose. °· Sneezing. °· Nasal congestion. °· Sinus irritation. °· Sore throat. °· Loss of voice (laryngitis). °· Cough. °· Fatigue. °· Muscle aches. °· Loss of appetite. °· Headache. °· Low-grade fever. °DIAGNOSIS  °You might diagnose your own cold based on familiar symptoms, since most people get a cold 2 to 3 times a year. Your caregiver can confirm this based on your exam. Most importantly, your caregiver can check that your symptoms are not due to another disease such as strep throat, sinusitis, pneumonia, asthma, or epiglottitis. Blood tests, throat tests, and X-rays are not necessary to diagnose a common cold, but they may sometimes be helpful in excluding other more serious diseases. Your caregiver will decide if any further tests are required. °RISKS AND COMPLICATIONS  °You may be at risk for a more severe case of the common cold if you smoke cigarettes, have chronic heart disease (such as heart failure) or lung disease (such as asthma), or if   you have a weakened immune system. The very young and very old are also at risk for more serious infections. Bacterial sinusitis, middle ear infections, and bacterial pneumonia can complicate the common cold. The common cold can worsen asthma and chronic obstructive pulmonary disease (COPD). Sometimes, these complications can require emergency medical care and may be life-threatening. °PREVENTION  °The best way to protect against getting a cold is to practice  good hygiene. Avoid oral or hand contact with people with cold symptoms. Wash your hands often if contact occurs. There is no clear evidence that vitamin C, vitamin E, echinacea, or exercise reduces the chance of developing a cold. However, it is always recommended to get plenty of rest and practice good nutrition. °TREATMENT  °Treatment is directed at relieving symptoms. There is no cure. Antibiotics are not effective, because the infection is caused by a virus, not by bacteria. Treatment may include: °· Increased fluid intake. Sports drinks offer valuable electrolytes, sugars, and fluids. °· Breathing heated mist or steam (vaporizer or shower). °· Eating chicken soup or other clear broths, and maintaining good nutrition. °· Getting plenty of rest. °· Using gargles or lozenges for comfort. °· Controlling fevers with ibuprofen or acetaminophen as directed by your caregiver. °· Increasing usage of your inhaler if you have asthma. °Zinc gel and zinc lozenges, taken in the first 24 hours of the common cold, can shorten the duration and lessen the severity of symptoms. Pain medicines may help with fever, muscle aches, and throat pain. A variety of non-prescription medicines are available to treat congestion and runny nose. Your caregiver can make recommendations and may suggest nasal or lung inhalers for other symptoms.  °HOME CARE INSTRUCTIONS  °· Only take over-the-counter or prescription medicines for pain, discomfort, or fever as directed by your caregiver. °· Use a warm mist humidifier or inhale steam from a shower to increase air moisture. This may keep secretions moist and make it easier to breathe. °· Drink enough water and fluids to keep your urine clear or pale yellow. °· Rest as needed. °· Return to work when your temperature has returned to normal or as your caregiver advises. You may need to stay home longer to avoid infecting others. You can also use a face mask and careful hand washing to prevent spread  of the virus. °SEEK MEDICAL CARE IF:  °· After the first few days, you feel you are getting worse rather than better. °· You need your caregiver's advice about medicines to control symptoms. °· You develop chills, worsening shortness of breath, or brown or red sputum. These may be signs of pneumonia. °· You develop yellow or brown nasal discharge or pain in the face, especially when you bend forward. These may be signs of sinusitis. °· You develop a fever, swollen neck glands, pain with swallowing, or white areas in the back of your throat. These may be signs of strep throat. °SEEK IMMEDIATE MEDICAL CARE IF:  °· You have a fever. °· You develop severe or persistent headache, ear pain, sinus pain, or chest pain. °· You develop wheezing, a prolonged cough, cough up blood, or have a change in your usual mucus (if you have chronic lung disease). °· You develop sore muscles or a stiff neck. °Document Released: 04/18/2001 Document Revised: 01/15/2012 Document Reviewed: 01/28/2014 °ExitCare® Patient Information ©2015 [employer]. This information is not intended to replace advice given to you by your health care provider. Make sure you discuss any questions you have with your health care   provider. ° °

## 2014-09-20 LAB — CULTURE, GROUP A STREP

## 2014-09-21 ENCOUNTER — Emergency Department (HOSPITAL_BASED_OUTPATIENT_CLINIC_OR_DEPARTMENT_OTHER): Payer: Medicaid Other

## 2014-09-21 ENCOUNTER — Encounter (HOSPITAL_BASED_OUTPATIENT_CLINIC_OR_DEPARTMENT_OTHER): Payer: Self-pay | Admitting: *Deleted

## 2014-09-21 ENCOUNTER — Emergency Department (HOSPITAL_BASED_OUTPATIENT_CLINIC_OR_DEPARTMENT_OTHER)
Admission: EM | Admit: 2014-09-21 | Discharge: 2014-09-21 | Disposition: A | Discharge status: Home / Self Care | Payer: Medicaid Other | Attending: Emergency Medicine | Admitting: Emergency Medicine

## 2014-09-21 DIAGNOSIS — I10 Essential (primary) hypertension: Secondary | ICD-10-CM | POA: Diagnosis not present

## 2014-09-21 DIAGNOSIS — K8020 Calculus of gallbladder without cholecystitis without obstruction: Secondary | ICD-10-CM | POA: Diagnosis not present

## 2014-09-21 DIAGNOSIS — Z3202 Encounter for pregnancy test, result negative: Secondary | ICD-10-CM | POA: Insufficient documentation

## 2014-09-21 DIAGNOSIS — E669 Obesity, unspecified: Secondary | ICD-10-CM | POA: Diagnosis not present

## 2014-09-21 DIAGNOSIS — Z79899 Other long term (current) drug therapy: Secondary | ICD-10-CM | POA: Diagnosis not present

## 2014-09-21 DIAGNOSIS — R1011 Right upper quadrant pain: Secondary | ICD-10-CM | POA: Diagnosis present

## 2014-09-21 LAB — COMPREHENSIVE METABOLIC PANEL
ALK PHOS: 157 U/L — AB (ref 39–117)
ALT: 12 U/L (ref 0–35)
ANION GAP: 10 (ref 5–15)
AST: 17 U/L (ref 0–37)
Albumin: 3.4 g/dL — ABNORMAL LOW (ref 3.5–5.2)
BILIRUBIN TOTAL: 0.4 mg/dL (ref 0.3–1.2)
BUN: 9 mg/dL (ref 6–23)
CO2: 27 meq/L (ref 19–32)
CREATININE: 0.6 mg/dL (ref 0.50–1.10)
Calcium: 9.3 mg/dL (ref 8.4–10.5)
Chloride: 106 mEq/L (ref 96–112)
GFR calc Af Amer: >90 mL/min (ref >90)
GLUCOSE: 88 mg/dL (ref 70–99)
POTASSIUM: 3.9 meq/L (ref 3.7–5.3)
Sodium: 143 mEq/L (ref 137–147)
Total Protein: 7.8 g/dL (ref 6.0–8.3)

## 2014-09-21 LAB — URINALYSIS, ROUTINE W REFLEX MICROSCOPIC
Bilirubin Urine: NEGATIVE
Glucose, UA: NEGATIVE mg/dL
Hgb urine dipstick: NEGATIVE
KETONES UR: NEGATIVE mg/dL
Nitrite: NEGATIVE
PH: 7 (ref 5.0–8.0)
PROTEIN: NEGATIVE mg/dL
Specific Gravity, Urine: 1.025 (ref 1.005–1.030)
Urobilinogen, UA: 1 mg/dL (ref 0.0–1.0)

## 2014-09-21 LAB — CBC WITH DIFFERENTIAL/PLATELET
Basophils Absolute: 0 10*3/uL (ref 0.0–0.1)
Basophils Relative: 0 % (ref 0–1)
Eosinophils Absolute: 0.1 10*3/uL (ref 0.0–0.7)
Eosinophils Relative: 1 % (ref 0–5)
HEMATOCRIT: 35.6 % — AB (ref 36.0–46.0)
Hemoglobin: 11.3 g/dL — ABNORMAL LOW (ref 12.0–15.0)
LYMPHS ABS: 2.1 10*3/uL (ref 0.7–4.0)
Lymphocytes Relative: 25 % (ref 12–46)
MCH: 25.4 pg — ABNORMAL LOW (ref 26.0–34.0)
MCHC: 31.7 g/dL (ref 30.0–36.0)
MCV: 80 fL (ref 78.0–100.0)
MONO ABS: 0.5 10*3/uL (ref 0.1–1.0)
MONOS PCT: 6 % (ref 3–12)
NEUTROS ABS: 5.9 10*3/uL (ref 1.7–7.7)
NEUTROS PCT: 68 % (ref 43–77)
Platelets: 255 10*3/uL (ref 150–400)
RBC: 4.45 MIL/uL (ref 3.87–5.11)
RDW: 14.5 % (ref 11.5–15.5)
WBC: 8.6 10*3/uL (ref 4.0–10.5)

## 2014-09-21 LAB — URINE MICROSCOPIC-ADD ON

## 2014-09-21 LAB — LIPASE, BLOOD: Lipase: 34 U/L (ref 11–59)

## 2014-09-21 LAB — PREGNANCY, URINE: PREG TEST UR: NEGATIVE

## 2014-09-21 MED ORDER — DEXTROSE 5 % IV SOLN
1.0000 g | Freq: Once | INTRAVENOUS | Status: AC
  Administered 2014-09-21: 1 g via INTRAVENOUS

## 2014-09-21 MED ORDER — MORPHINE SULFATE 4 MG/ML IJ SOLN
4.0000 mg | Freq: Once | INTRAMUSCULAR | Status: DC
  Filled 2014-09-21: qty 1

## 2014-09-21 MED ORDER — OXYCODONE-ACETAMINOPHEN 5-325 MG PO TABS: 1.0000 | ORAL_TABLET | Freq: Four times a day (QID) | ORAL | Status: DC | PRN

## 2014-09-21 MED ORDER — ONDANSETRON HCL 4 MG PO TABS: 4.0000 mg | ORAL_TABLET | Freq: Four times a day (QID) | ORAL | Status: DC

## 2014-09-21 MED ORDER — ONDANSETRON HCL 4 MG/2ML IJ SOLN
4.0000 mg | Freq: Once | INTRAMUSCULAR | Status: DC
  Filled 2014-09-21: qty 2

## 2014-09-21 MED ORDER — CEFTRIAXONE SODIUM 1 G IJ SOLR
INTRAMUSCULAR | Status: AC
  Filled 2014-09-21: qty 10

## 2014-09-21 NOTE — Discharge Instructions (Signed)
Take pain medication as needed.  Do not drive or operate heavy machinery for 4-6 hours after taking medication. °

## 2014-09-21 NOTE — ED Notes (Signed)
PA at bedside.

## 2014-09-21 NOTE — ED Notes (Signed)
Pt c/o right upper abd pain " gallstone pain " x 12 hrs

## 2014-09-21 NOTE — ED Provider Notes (Signed)
CSN: [id number]     Arrival date & time 09/21/14  1554 History   First MD Initiated Contact with Patient 09/21/14 1603     No chief complaint on file.    (Consider location/radiation/quality/duration/timing/severity/associated sxs/prior Treatment) HPI Comments: Patient with a history of known cholelithiasis presents today with a chief complaint of RUQ abdominal pain.  She reports that the pain has been constant since last evening.  She describes the pain as sharp and states that it radiates to her back.  She has not taken anything for the pain prior to arrival.  She was seen by [facility] on 08/13/14 and had a ERCP done and 2 CBD stones were removed.  She was also evaluated by [facility] at that time, but opted to not have surgery due to issues with child care and FMLA.  She reports that the pain today feels similar to pain that she has had in the past, but feels worse.  Pain associated with nausea, but no vomiting.  No fever, chills, urinary symptoms, vaginal discharge, diarrhea, or constipation.  The history is provided by the patient.    Past Medical History  Diagnosis Date  . Hypertension   . Obesity     220 #, BMI 36 07/2014  . Gallstones 07/2015    Pt cancelled lap chole planned for 9/24   Past Surgical History  Procedure Laterality Date  . Cesarean section    . Ercp N/A 08/12/2014    Procedure: ENDOSCOPIC RETROGRADE CHOLANGIOPANCREATOGRAPHY (ERCP);  Surgeon: Meryl DareMalcolm T Stark, MD;  Location: [facility];  Service: Endoscopy;  Laterality: N/A;   Family History  Problem Relation Age of Onset  . Diabetes Mother   . Hypertension Mother   . Heart disease Father   . Breast cancer Sister    History  Substance Use Topics  . Smoking status: Never Smoker   . Smokeless tobacco: Never Used  . Alcohol Use: No   OB History    No data available     Review of Systems  All other systems reviewed and are negative.     Allergies  Ibuprofen  Home Medications   Prior to Admission  medications   Medication Sig Start Date End Date Taking? Authorizing Provider  acetaminophen (TYLENOL) 325 MG tablet Take 2 tablets (650 mg total) by mouth every 4 (four) hours as needed for mild pain, moderate pain, fever or headache. 08/13/14   Sherrie GeorgeWillard Jennings, PA-C  acetaminophen-codeine 120-12 MG/5ML suspension Take 5 mLs by mouth every 6 (six) hours as needed for pain. 09/18/14   Teressa LowerVrinda Pickering, NP  albuterol (PROVENTIL HFA;VENTOLIN HFA) 108 (90 BASE) MCG/ACT inhaler Inhale 2 puffs into the lungs every 4 (four) hours as needed for wheezing. 09/29/11 08/11/14  Suzi RootsKevin E Steinl, MD  ondansetron (ZOFRAN) 4 MG tablet Take 1 tablet (4 mg total) by mouth every 8 (eight) hours as needed for nausea. 08/13/14   Sherrie GeorgeWillard Jennings, PA-C  oxyCODONE-acetaminophen (ROXICET) 5-325 MG per tablet Take 1-2 tablets by mouth every 4 (four) hours as needed for severe pain. 08/13/14   Sherrie GeorgeWillard Jennings, PA-C   BP 135/75 mmHg  Pulse 79  Temp(Src) 98.6 F (37 C) (Oral)  Resp 16  Wt 225 lb (102.059 kg)  LMP 09/14/2014 Physical Exam  Constitutional: She appears well-developed and well-nourished.  HENT:  Head: Normocephalic and atraumatic.  Mouth/Throat: Oropharynx is clear and moist.  Neck: Normal range of motion. Neck supple.  Cardiovascular: Normal rate, regular rhythm and normal heart sounds.   Pulmonary/Chest: Effort normal  and breath sounds normal.  Abdominal: Soft. Bowel sounds are normal. She exhibits no distension and no mass. There is tenderness in the right upper quadrant. There is no rebound, no guarding and negative Murphy's sign.  Musculoskeletal: Normal range of motion.  Neurological: She is alert.  Skin: Skin is warm and dry.  Psychiatric: She has a normal mood and affect.  Nursing note and vitals reviewed.   ED Course  Procedures (including critical care time) Labs Review Labs Reviewed  PREGNANCY, URINE  URINALYSIS, ROUTINE W REFLEX MICROSCOPIC  CBC WITH DIFFERENTIAL  COMPREHENSIVE  METABOLIC PANEL  LIPASE, BLOOD    Imaging Review [place] Abdomen Complete  09/21/2014   CLINICAL DATA:  28 year old female with 12 hr of abdominal pain. Prior history of choledocholithiasis status post ERCP on 08/12/2014 to remove ductal stones.  EXAM: ULTRASOUND ABDOMEN COMPLETE  COMPARISON:  Abdominal ultrasound 07/29/2014.  FINDINGS: Gallbladder: Gallbladder appears nearly completely contracted. Gallbladder wall is mildly thickened (3.5 mm), however, this is likely related to under distention of the gallbladder. There are multiple echogenic shadowing foci in the gallbladder, compatible with small gallstones. No pericholecystic fluid. No sonographic Murphy sign noted.  Common bile duct: Diameter: 1.6-3.2 mm  Liver: No focal lesion identified. No intrahepatic biliary ductal dilatation. Within normal limits in parenchymal echogenicity.  IVC: No abnormality visualized.  Pancreas: Visualized portion unremarkable.  Spleen: Size and appearance within normal limits. 10.2 cm in length.  Right Kidney: Length: 11 cm. Echogenicity within normal limits. No mass or hydronephrosis visualized.  Left Kidney: Length: 11.8 cm. Echogenicity within normal limits. No mass or hydronephrosis visualized.  Abdominal aorta: No aneurysm visualized.  Other findings: None.  IMPRESSION: 1. Study is positive for cholelithiasis, but there is no evidence to suggest acute cholecystitis at this time. 2. Additionally, there is no intra or extrahepatic biliary ductal dilatation to suggest recurrent choledocholithiasis.   Electronically Signed   By: Trudie Reedaniel  Entrikin M.D.   On: 09/21/2014 18:16     EKG Interpretation None     6:17 PM Reassessed patient.  Pain and nausea are controlled at this time. MDM   Final diagnoses:  RUQ pain   Patient with known Cholelithiasis presents today with RUQ abdominal pain.   She had a ERCP done approximately one month ago and had two stones removed.  She was referred to [facility] for surgery, but elected to  not have the surgery done at that time.  Today her labs are unremarkable.  Pain and nausea controlled in the ED.  Ultrasound negative for Cholecystitis.  Patient instructed to follow up with [facility].  Stable for discharge.  Return precautions given.    Santiago GladHeather Kristin Barcus, PA-C 09/22/14 [id number]  Toy CookeyMegan Docherty, MD 09/22/14 [phone]

## 2014-09-21 NOTE — ED Notes (Signed)
Pt back from ultrasound.

## 2014-09-22 MED ORDER — NITROFURANTOIN MONOHYD MACRO 100 MG PO CAPS: 100.0000 mg | ORAL_CAPSULE | Freq: Two times a day (BID) | ORAL | Status: DC

## 2014-10-05 NOTE — Discharge Summary (Signed)
Physician Discharge Summary  Patient ID: Simon RheinLatisha M Tutterow MRN: [id number] DOB/AGE: December 24, 1985 28 y.o.  Admit date: 08/11/2014 Discharge date: 08/13/14  Admission Diagnoses:  Cholelithiasis with choledocholithiasis  Discharge Diagnoses:    Cholelithiasis with choledocholithiasis s/p ERCP and stone extraction.    Discharged Condition: good  Hospital Course:   She was admitted and GI consultation was obtained.  She underwent ERCP and CBD stone extraction which she tolerated well.  Cholecystectomy was recommended to her as an inpatient.  However, she had issues with childcare and work and wanted to have the surgery done at a later time.  This was felt to be okay and she was discharged.  Dr. Ezzard StandingNewman informed our office to work with her to post the surgery at a later date.  Consults: GI  Discharge Exam: Blood pressure 112/62, pulse 97, temperature 97.6 F (36.4 C), temperature source Oral, resp. rate 12, height 5\' 5"  (1.651 m), weight 220 lb (99.791 kg), last menstrual period 07/27/2014, SpO2 100 %.   Disposition: 01-Home or Self Care     Medication List    TAKE these medications        acetaminophen 325 MG tablet  Commonly known as:  TYLENOL  Take 2 tablets (650 mg total) by mouth every 4 (four) hours as needed for mild pain, moderate pain, fever or headache.     albuterol 108 (90 BASE) MCG/ACT inhaler  Commonly known as:  PROVENTIL HFA;VENTOLIN HFA  Inhale 2 puffs into the lungs every 4 (four) hours as needed for wheezing.     ondansetron 4 MG tablet  Commonly known as:  ZOFRAN  Take 1 tablet (4 mg total) by mouth every 8 (eight) hours as needed for nausea.     oxyCODONE-acetaminophen 5-325 MG per tablet  Commonly known as:  ROXICET  Take 1-2 tablets by mouth every 4 (four) hours as needed for severe pain.           Follow-up Information    [facility].   Specialty:  General Surgery   Why:  Make an appointment for follow up and when you want to schedule  surgery.  Call if you have trouble again with eating, nausea and vomiting.     Contact information:   [address] [place] [address] [phone]       Signed: Adolph PollackROSENBOWER,Morgen Linebaugh J 10/05/2014, 11:09 AM

## 2015-03-15 ENCOUNTER — Telehealth: Payer: Self-pay | Admitting: Gastroenterology

## 2015-03-15 NOTE — Telephone Encounter (Signed)
Advised pt that the FMLA paperwork would need to be addressed by PCP or [facility] since Dr. Russella DarStark was only a consulting MD for the case [facility].  Pt understood.

## 2015-03-31 NOTE — ED Notes (Signed)
Patient came to the department today requesting completion of disability paperwork from 09/21/14 for her job.  Patient became very upset stating that we have completed this type of paper work for her in the past.  Reviewed with ED Director and gave patient a note stating when she was seen, her diagnoses and needed follow up.

## 2015-06-22 ENCOUNTER — Emergency Department (HOSPITAL_BASED_OUTPATIENT_CLINIC_OR_DEPARTMENT_OTHER)
Admission: EM | Admit: 2015-06-22 | Discharge: 2015-06-22 | Disposition: A | Discharge status: Home / Self Care | Payer: 59 | Attending: Emergency Medicine | Admitting: Emergency Medicine

## 2015-06-22 ENCOUNTER — Encounter (HOSPITAL_BASED_OUTPATIENT_CLINIC_OR_DEPARTMENT_OTHER): Payer: Self-pay | Admitting: *Deleted

## 2015-06-22 DIAGNOSIS — Z79899 Other long term (current) drug therapy: Secondary | ICD-10-CM | POA: Diagnosis not present

## 2015-06-22 DIAGNOSIS — J0110 Acute frontal sinusitis, unspecified: Secondary | ICD-10-CM

## 2015-06-22 DIAGNOSIS — E669 Obesity, unspecified: Secondary | ICD-10-CM | POA: Insufficient documentation

## 2015-06-22 DIAGNOSIS — Z8719 Personal history of other diseases of the digestive system: Secondary | ICD-10-CM | POA: Insufficient documentation

## 2015-06-22 DIAGNOSIS — H00023 Hordeolum internum right eye, unspecified eyelid: Secondary | ICD-10-CM | POA: Diagnosis not present

## 2015-06-22 DIAGNOSIS — H00013 Hordeolum externum right eye, unspecified eyelid: Secondary | ICD-10-CM

## 2015-06-22 DIAGNOSIS — Z7951 Long term (current) use of inhaled steroids: Secondary | ICD-10-CM | POA: Insufficient documentation

## 2015-06-22 DIAGNOSIS — I10 Essential (primary) hypertension: Secondary | ICD-10-CM | POA: Diagnosis not present

## 2015-06-22 DIAGNOSIS — J0100 Acute maxillary sinusitis, unspecified: Secondary | ICD-10-CM | POA: Diagnosis not present

## 2015-06-22 DIAGNOSIS — R51 Headache: Secondary | ICD-10-CM | POA: Diagnosis present

## 2015-06-22 MED ORDER — ACETAMINOPHEN 325 MG PO TABS
650.0000 mg | ORAL_TABLET | Freq: Once | ORAL | Status: AC
  Administered 2015-06-22: 650 mg via ORAL
  Filled 2015-06-22: qty 2

## 2015-06-22 MED ORDER — ACETAMINOPHEN 325 MG PO TABS: 325.0000 mg | ORAL_TABLET | Freq: Once | ORAL | Status: AC

## 2015-06-22 MED ORDER — FLUTICASONE PROPIONATE 50 MCG/ACT NA SUSP: 2.0000 | Freq: Every day | NASAL | Status: DC

## 2015-06-22 MED ORDER — AMOXICILLIN 500 MG PO CAPS: 500.0000 mg | ORAL_CAPSULE | Freq: Three times a day (TID) | ORAL | Status: DC

## 2015-06-22 NOTE — Discharge Instructions (Signed)
Take amoxicillin as directed. It is important to complete the entire course of antibiotics. Use nasal spray as directed. Apply warm compresses to your eye. Do not use your contacts until resolution of the stye. Follow-up with your eye doctor.  Sinusitis Sinusitis is redness, soreness, and inflammation of the paranasal sinuses. Paranasal sinuses are air pockets within the bones of your face (beneath the eyes, the middle of the forehead, or above the eyes). In healthy paranasal sinuses, mucus is able to drain out, and air is able to circulate through them by way of your nose. However, when your paranasal sinuses are inflamed, mucus and air can become trapped. This can allow bacteria and other germs to grow and cause infection. Sinusitis can develop quickly and last only a short time (acute) or continue over a long period (chronic). Sinusitis that lasts for more than 12 weeks is considered chronic.  CAUSES  Causes of sinusitis include:  Allergies.  Structural abnormalities, such as displacement of the cartilage that separates your nostrils (deviated septum), which can decrease the air flow through your nose and sinuses and affect sinus drainage.  Functional abnormalities, such as when the small hairs (cilia) that line your sinuses and help remove mucus do not work properly or are not present. SIGNS AND SYMPTOMS  Symptoms of acute and chronic sinusitis are the same. The primary symptoms are pain and pressure around the affected sinuses. Other symptoms include:  Upper toothache.  Earache.  Headache.  Bad breath.  Decreased sense of smell and taste.  A cough, which worsens when you are lying flat.  Fatigue.  Fever.  Thick drainage from your nose, which often is green and may contain pus (purulent).  Swelling and warmth over the affected sinuses. DIAGNOSIS  Your health care provider will perform a physical exam. During the exam, your health care provider may:  Look in your nose for  signs of abnormal growths in your nostrils (nasal polyps).  Tap over the affected sinus to check for signs of infection.  View the inside of your sinuses (endoscopy) using an imaging device that has a light attached (endoscope). If your health care provider suspects that you have chronic sinusitis, one or more of the following tests may be recommended:  Allergy tests.  Nasal culture. A sample of mucus is taken from your nose, sent to a lab, and screened for bacteria.  Nasal cytology. A sample of mucus is taken from your nose and examined by your health care provider to determine if your sinusitis is related to an allergy. TREATMENT  Most cases of acute sinusitis are related to a viral infection and will resolve on their own within 10 days. Sometimes medicines are prescribed to help relieve symptoms (pain medicine, decongestants, nasal steroid sprays, or saline sprays).  However, for sinusitis related to a bacterial infection, your health care provider will prescribe antibiotic medicines. These are medicines that will help kill the bacteria causing the infection.  Rarely, sinusitis is caused by a fungal infection. In theses cases, your health care provider will prescribe antifungal medicine. For some cases of chronic sinusitis, surgery is needed. Generally, these are cases in which sinusitis recurs more than 3 times per year, despite other treatments. HOME CARE INSTRUCTIONS   Drink plenty of water. Water helps thin the mucus so your sinuses can drain more easily.  Use a humidifier.  Inhale steam 3 to 4 times a day (for example, sit in the bathroom with the shower running).  Apply a warm, moist washcloth  to your face 3 to 4 times a day, or as directed by your health care provider.  Use saline nasal sprays to help moisten and clean your sinuses.  Take medicines only as directed by your health care provider.  If you were prescribed either an antibiotic or antifungal medicine, finish it all  even if you start to feel better. SEEK IMMEDIATE MEDICAL CARE IF:  You have increasing pain or severe headaches.  You have nausea, vomiting, or drowsiness.  You have swelling around your face.  You have vision problems.  You have a stiff neck.  You have difficulty breathing. MAKE SURE YOU:   Understand these instructions.  Will watch your condition.  Will get help right away if you are not doing well or get worse. Document Released: 10/23/2005 Document Revised: 03/09/2014 Document Reviewed: 11/07/2011 [facility] Patient Information 2015 [place], [place]. This information is not intended to replace advice given to you by your health care provider. Make sure you discuss any questions you have with your health care provider.  Sty A sty (hordeolum) is an infection of a gland in the eyelid located at the base of the eyelash. A sty may develop a white or yellow head of pus. It can be puffy (swollen). Usually, the sty will burst and pus will come out on its own. They do not leave lumps in the eyelid once they drain. A sty is often confused with another form of cyst of the eyelid called a chalazion. Chalazions occur within the eyelid and not on the edge where the bases of the eyelashes are. They often are red, sore and then form firm lumps in the eyelid. CAUSES   Germs (bacteria).  Lasting (chronic) eyelid inflammation. SYMPTOMS   Tenderness, redness and swelling along the edge of the eyelid at the base of the eyelashes.  Sometimes, there is a white or yellow head of pus. It may or may not drain. DIAGNOSIS  An ophthalmologist will be able to distinguish between a sty and a chalazion and treat the condition appropriately.  TREATMENT   Styes are typically treated with warm packs (compresses) until drainage occurs.  In rare cases, medicines that kill germs (antibiotics) may be prescribed. These antibiotics may be in the form of drops, cream or pills.  If a hard lump has formed, it is  generally necessary to do a small incision and remove the hardened contents of the cyst in a minor surgical procedure done in the office.  In suspicious cases, your caregiver may send the contents of the cyst to the lab to be certain that it is not a rare, but dangerous form of cancer of the glands of the eyelid. HOME CARE INSTRUCTIONS   Wash your hands often and dry them with a clean towel. Avoid touching your eyelid. This may spread the infection to other parts of the eye.  Apply heat to your eyelid for 10 to 20 minutes, several times a day, to ease pain and help to heal it faster.  Do not squeeze the sty. Allow it to drain on its own. Wash your eyelid carefully 3 to 4 times per day to remove any pus. SEEK IMMEDIATE MEDICAL CARE IF:   Your eye becomes painful or puffy (swollen).  Your vision changes.  Your sty does not drain by itself within 3 days.  Your sty comes back within a short period of time, even with treatment.  You have redness (inflammation) around the eye.  You have a fever. Document Released: 08/02/2005  Document Revised: 01/15/2012 Document Reviewed: 02/06/2014 [facility] Patient Information 2015 [place], [place]. This information is not intended to replace advice given to you by your health care provider. Make sure you discuss any questions you have with your health care provider.

## 2015-06-22 NOTE — ED Provider Notes (Signed)
CSN: [id number]     Arrival date & time 06/22/15  1836 History   First MD Initiated Contact with Patient 06/22/15 1846     Chief Complaint  Patient presents with  . Conjunctivitis     (Consider location/radiation/quality/duration/timing/severity/associated sxs/prior Treatment) HPI Comments: 29 year old female complaining of sinus pressure, headache, chills, nasal congestion and right eyelid swelling 2 days. Yesterday she noticed a stye to her right eye, and this morning the area got more swollen. It is tender to touch. Denies redness to her eye. She uses contacts but took them out when she noticed the stye. Denies eye pain. Has a lot of pressure behind her sinuses that is radiating throughout her head. Recently diagnosed with nasal polyps. Did not realize she had a fever until she got to the ED. No medications PTA. No aggravating or alleviating factors.  The history is provided by the patient.    Past Medical History  Diagnosis Date  . Hypertension   . Obesity     220 #, BMI 36 07/2014  . Gallstones 07/2015    Pt cancelled lap chole planned for 9/24   Past Surgical History  Procedure Laterality Date  . Cesarean section    . Ercp N/A 08/12/2014    Procedure: ENDOSCOPIC RETROGRADE CHOLANGIOPANCREATOGRAPHY (ERCP);  Surgeon: Meryl Dare, MD;  Location: [facility];  Service: Endoscopy;  Laterality: N/A;   Family History  Problem Relation Age of Onset  . Diabetes Mother   . Hypertension Mother   . Heart disease Father   . Breast cancer Sister    Social History  Substance Use Topics  . Smoking status: Never Smoker   . Smokeless tobacco: Never Used  . Alcohol Use: No   OB History    No data available     Review of Systems  HENT: Positive for sinus pressure.   Eyes:       + R eyelid swelling.  All other systems reviewed and are negative.     Allergies  Ibuprofen  Home Medications   Prior to Admission medications   Medication Sig Start Date End Date Taking?  Authorizing Provider  acetaminophen (TYLENOL) 325 MG tablet Take 2 tablets (650 mg total) by mouth every 4 (four) hours as needed for mild pain, moderate pain, fever or headache. 08/13/14   Sherrie George, PA-C  acetaminophen-codeine 120-12 MG/5ML suspension Take 5 mLs by mouth every 6 (six) hours as needed for pain. 09/18/14   Teressa Lower, NP  albuterol (PROVENTIL HFA;VENTOLIN HFA) 108 (90 BASE) MCG/ACT inhaler Inhale 2 puffs into the lungs every 4 (four) hours as needed for wheezing. 09/29/11 08/11/14  Cathren Laine, MD  amoxicillin (AMOXIL) 500 MG capsule Take 1 capsule (500 mg total) by mouth 3 (three) times daily. 06/22/15   Celine Dishman M Raford Brissett, PA-C  fluticasone (FLONASE) 50 MCG/ACT nasal spray Place 2 sprays into both nostrils daily. 06/22/15   Kathrynn Speed, PA-C  nitrofurantoin, macrocrystal-monohydrate, (MACROBID) 100 MG capsule Take 1 capsule (100 mg total) by mouth 2 (two) times daily. 09/22/14   Heather Laisure, PA-C  ondansetron (ZOFRAN) 4 MG tablet Take 1 tablet (4 mg total) by mouth every 8 (eight) hours as needed for nausea. 08/13/14   Sherrie George, PA-C  ondansetron (ZOFRAN) 4 MG tablet Take 1 tablet (4 mg total) by mouth every 6 (six) hours. 09/21/14   Santiago Glad, PA-C  oxyCODONE-acetaminophen (PERCOCET/ROXICET) 5-325 MG per tablet Take 1-2 tablets by mouth every 6 (six) hours as needed for severe pain. 09/21/14  Santiago Glad, PA-C  oxyCODONE-acetaminophen (ROXICET) 5-325 MG per tablet Take 1-2 tablets by mouth every 4 (four) hours as needed for severe pain. 08/13/14   Sherrie George, PA-C   BP 146/86 mmHg  Pulse 102  Temp(Src) 99 F (37.2 C) (Oral)  Resp 20  Ht 5\' 5"  (1.651 m)  Wt 220 lb (99.791 kg)  BMI 36.61 kg/m2  SpO2 100%  LMP 06/15/2015 Physical Exam  Constitutional: She is oriented to person, place, and time. She appears well-developed and well-nourished. No distress.  HENT:  Head: Normocephalic and atraumatic.  Nose: Mucosal edema present. Right sinus  exhibits maxillary sinus tenderness and frontal sinus tenderness. Left sinus exhibits maxillary sinus tenderness and frontal sinus tenderness.  Mouth/Throat: Oropharynx is clear and moist.  Post nasal drip.  Eyes: Conjunctivae and EOM are normal. Pupils are equal, round, and reactive to light. Lids are everted and swept, no foreign bodies found. Right eye exhibits hordeolum.  Swelling to R eyelid. No warmth or induration. No pain with EOM.  Neck: Normal range of motion. Neck supple.  Cardiovascular: Normal rate, regular rhythm and normal heart sounds.   Pulmonary/Chest: Effort normal and breath sounds normal. No respiratory distress.  Musculoskeletal: Normal range of motion. She exhibits no edema.  Neurological: She is alert and oriented to person, place, and time. No sensory deficit.  Skin: Skin is warm and dry.  Psychiatric: She has a normal mood and affect. Her behavior is normal.  Nursing note and vitals reviewed.   ED Course  Procedures (including critical care time) Labs Review Labs Reviewed - No data to display  Imaging Review No results found. I have personally reviewed and evaluated these images and lab results as part of my medical decision-making.   EKG Interpretation None      MDM   Final diagnoses:  Acute frontal sinusitis, recurrence not specified  Acute maxillary sinusitis, recurrence not specified  Stye, right   Nontoxic appearing, no apparent distress. Febrile 101 on arrival with heart rate of 122. No medications prior to arrival. She did not know she had a fever. She has significant bilateral maxillary sinus tenderness along with bilateral frontal sinus tenderness. Her nose is very inflamed with a large amount of postnasal drip. The fever is most likely coming from sinusitis. I think this stye is a separate issue. No evidence of periorbital cellulitis. Stye is visible and I advised warm compresses and no contact use until resolution and follow-up with  ophthalmologist. Amoxicillin and Flonase for sinusitis. Tylenol given for fever here in the ED with temp decreasing to 99 and heart rate of 102. Follow-up with PCP within one week. Stable for discharge. Return precautions given. Patient states understanding of treatment care plan and is agreeable.  Kathrynn Speed, PA-C 06/22/15 1959  Benjiman Core, MD 06/22/15 2308

## 2015-06-22 NOTE — ED Notes (Signed)
Right eye lid is red and swollen. Headache. Chills.

## 2015-07-08 DIAGNOSIS — K8020 Calculus of gallbladder without cholecystitis without obstruction: Secondary | ICD-10-CM

## 2015-07-08 HISTORY — DX: Calculus of gallbladder without cholecystitis without obstruction: K80.20

## 2015-07-11 IMAGING — US US ABDOMEN LIMITED
1 series · 14 of 25 positions shown · non-contrast
Comparison: Ultrasound dated 07/29/2014

CLINICAL DATA: Acute onset of right upper quadrant pain 12 hr ago.

EXAM:
US ABDOMEN LIMITED - RIGHT UPPER QUADRANT

[Series 1: us abdomen limited · 0.24mm/px · 14 of 42 slices shown]
[im 1/42]
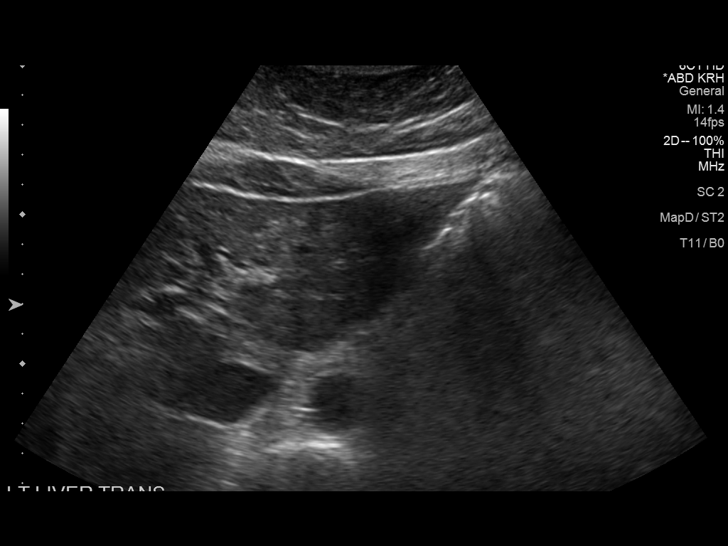
[im 4/42]
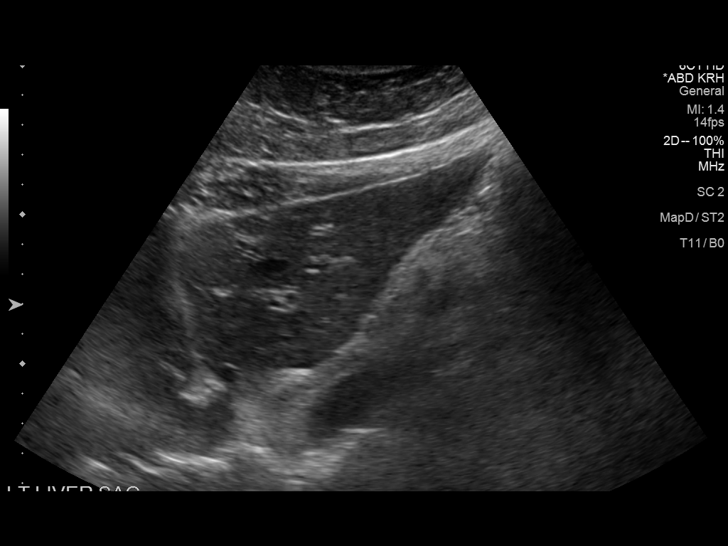
[im 7/42]
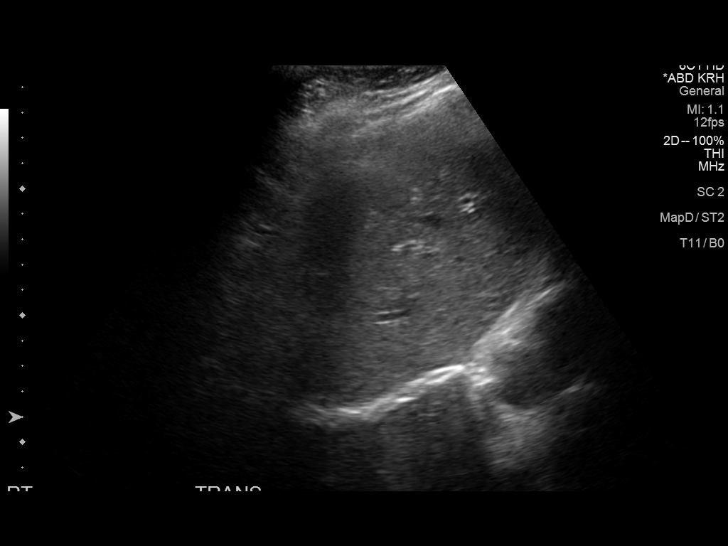
[im 11/42]
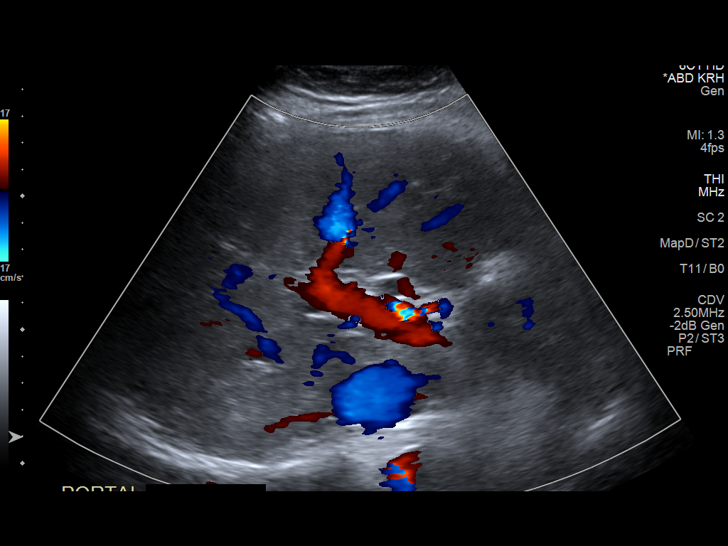
[im 14/42]
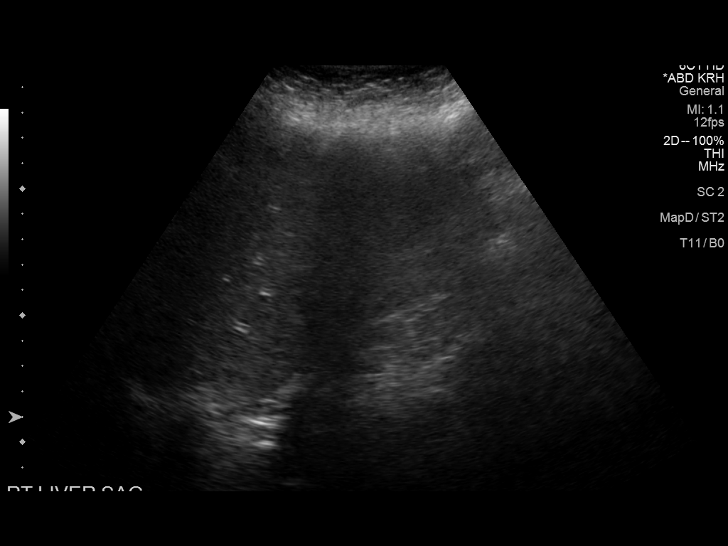
[im 16/42]
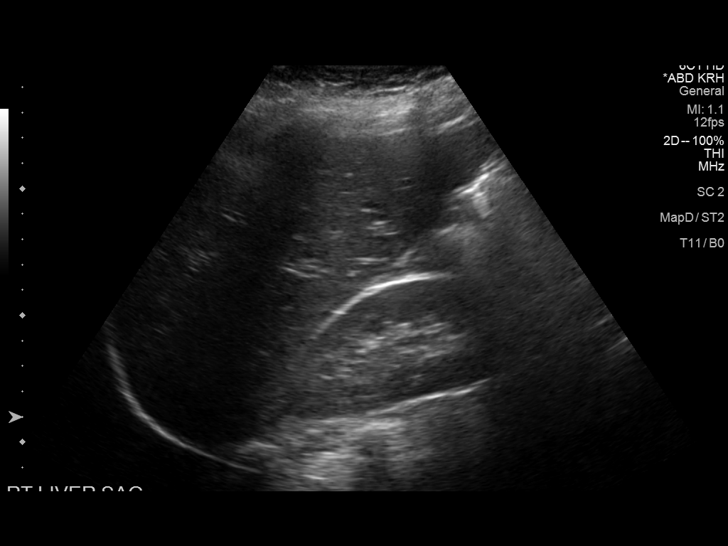
[im 19/42]
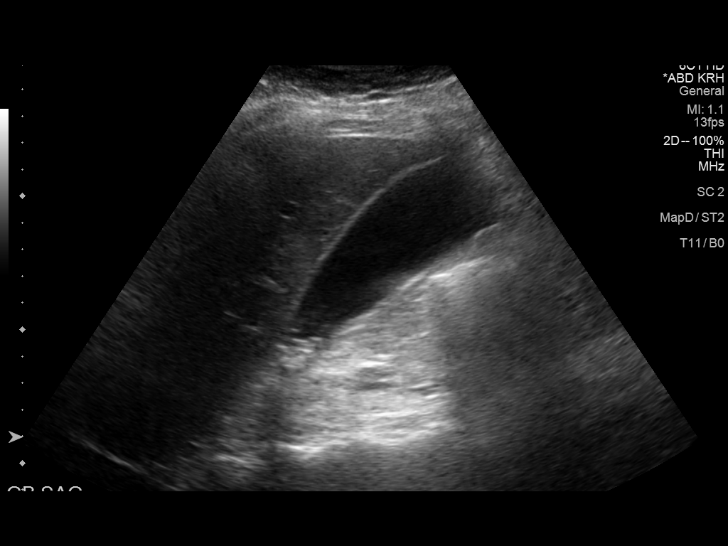
[im 23/42]
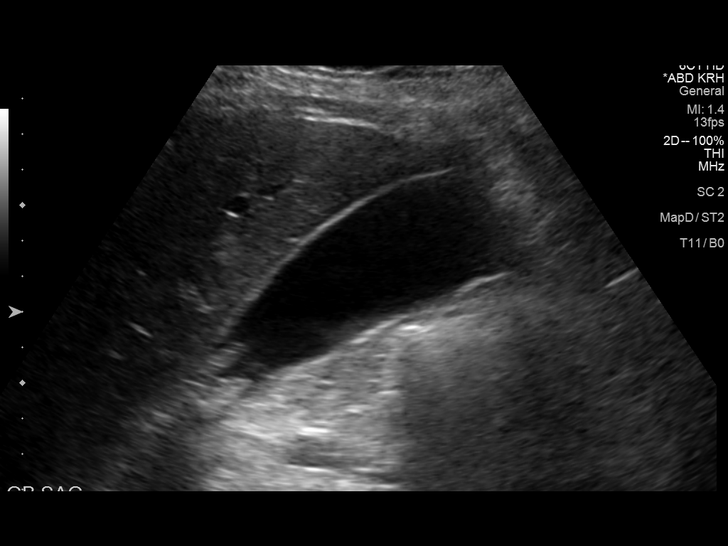
[im 26/42]
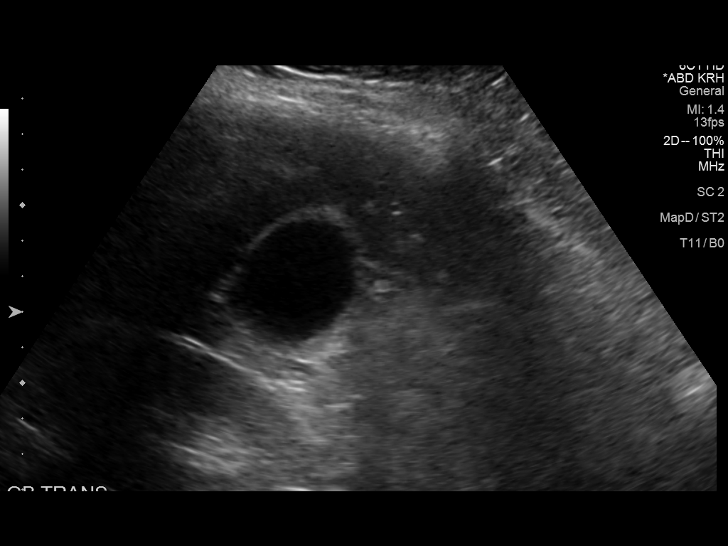
[im 28/42]
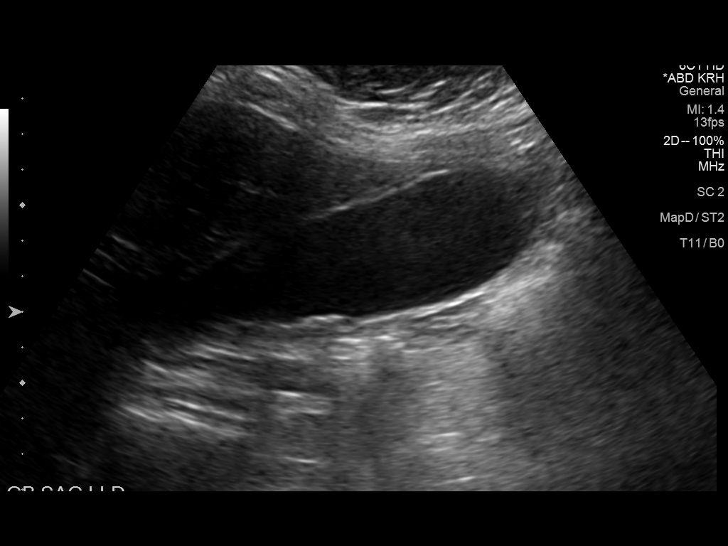
[im 31/42]
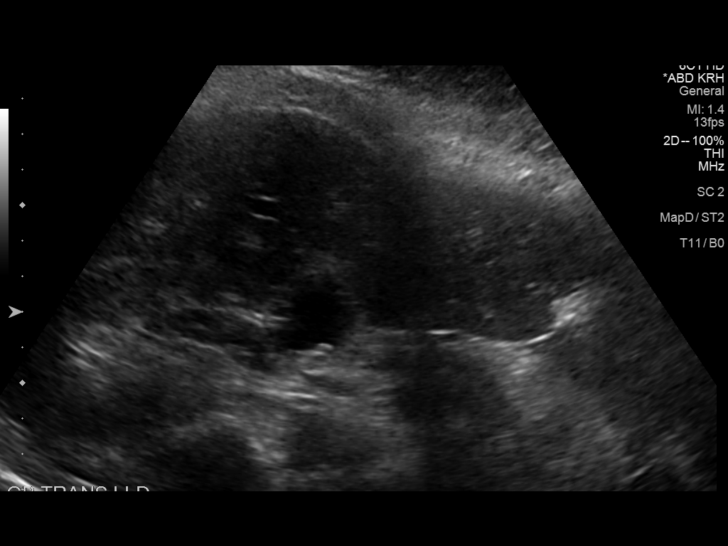
[im 35/42]
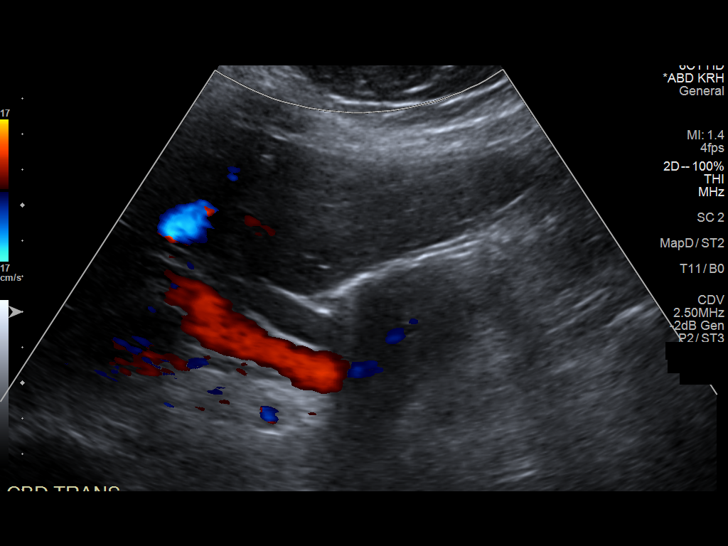
[im 38/42]
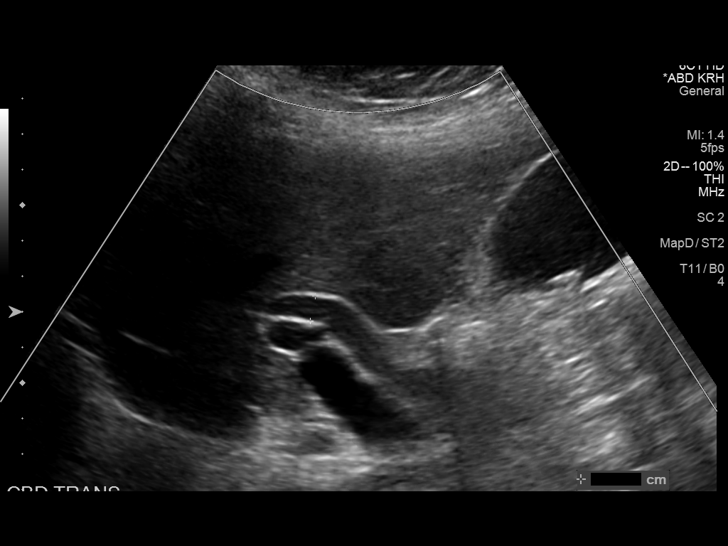
[im 42/42]
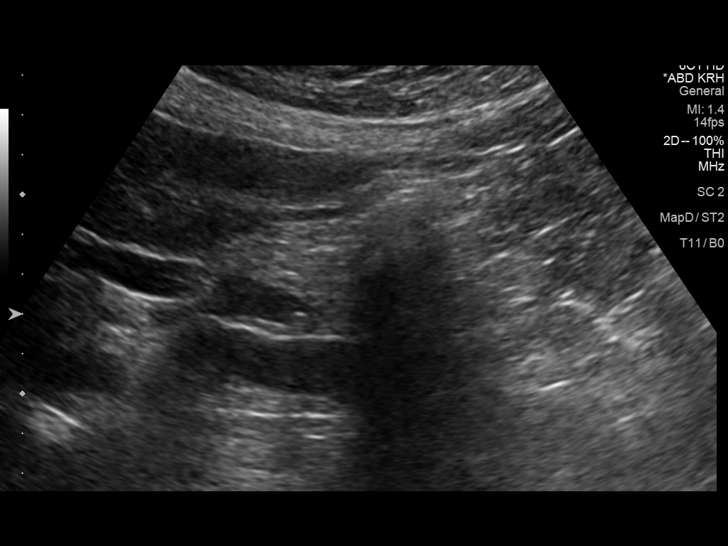

[14 of 25 positions shown; findings below may reference images not displayed]

FINDINGS: Gallbladder:

There are numerous small stones in the gallbladder. Gallbladder wall
is not thickened.

Common bile duct:

Diameter: The patient has new stones in the common bile duct and the
bile duct is now dilated to a diameter of 10 mm.

Liver:

No focal lesion identified. Within normal limits in parenchymal
echogenicity.
IMPRESSION: New biliary ductal dilatation with new common bile duct stones.
Multiple small gallstones.

## 2019-02-27 ENCOUNTER — Emergency Department (HOSPITAL_BASED_OUTPATIENT_CLINIC_OR_DEPARTMENT_OTHER)
Admission: EM | Admit: 2019-02-27 | Discharge: 2019-02-27 | Disposition: A | Discharge status: Home / Self Care | Payer: 59 | Attending: Emergency Medicine | Admitting: Emergency Medicine

## 2019-02-27 ENCOUNTER — Other Ambulatory Visit: Payer: Self-pay

## 2019-02-27 ENCOUNTER — Encounter (HOSPITAL_BASED_OUTPATIENT_CLINIC_OR_DEPARTMENT_OTHER): Payer: Self-pay

## 2019-02-27 DIAGNOSIS — R51 Headache: Secondary | ICD-10-CM | POA: Insufficient documentation

## 2019-02-27 DIAGNOSIS — N760 Acute vaginitis: Secondary | ICD-10-CM | POA: Insufficient documentation

## 2019-02-27 DIAGNOSIS — I10 Essential (primary) hypertension: Secondary | ICD-10-CM | POA: Insufficient documentation

## 2019-02-27 DIAGNOSIS — Z79899 Other long term (current) drug therapy: Secondary | ICD-10-CM | POA: Insufficient documentation

## 2019-02-27 DIAGNOSIS — G8929 Other chronic pain: Secondary | ICD-10-CM | POA: Insufficient documentation

## 2019-02-27 DIAGNOSIS — Z113 Encounter for screening for infections with a predominantly sexual mode of transmission: Secondary | ICD-10-CM | POA: Insufficient documentation

## 2019-02-27 DIAGNOSIS — B9689 Other specified bacterial agents as the cause of diseases classified elsewhere: Secondary | ICD-10-CM

## 2019-02-27 DIAGNOSIS — R519 Headache, unspecified: Secondary | ICD-10-CM

## 2019-02-27 LAB — WET PREP, GENITAL
Sperm: NONE SEEN
Trich, Wet Prep: NONE SEEN
Yeast Wet Prep HPF POC: NONE SEEN

## 2019-02-27 MED ORDER — PROMETHAZINE HCL 25 MG PO TABS: 25.0000 mg | ORAL_TABLET | Freq: Four times a day (QID) | ORAL | 0 refills | Status: DC | PRN

## 2019-02-27 MED ORDER — METRONIDAZOLE 500 MG PO TABS: 500.0000 mg | ORAL_TABLET | Freq: Two times a day (BID) | ORAL | 0 refills | Status: DC

## 2019-02-27 MED FILL — PROMETHAZINE 25 MG TABLET: 25 | 3 days supply | Qty: 10 | Fill #0

## 2019-02-27 MED FILL — metroNIDAZOLE 500 MG TABS: 500 | 7 days supply | Qty: 14 | Fill #0

## 2019-02-27 NOTE — ED Provider Notes (Signed)
[facility] EMERGENCY DEPARTMENT Provider Note   CSN: [id number] Arrival date & time: 02/27/19  1147    History   Chief Complaint Chief Complaint  Patient presents with  . Headache    HPI Melinda Frost is a 33 y.o. female.     Patient with history of hypertension presents the emergency department today with complaint of recurrent headaches.  Patient states that she has had headaches "for years".  They have been occurring almost daily.  Sometimes patient will have a pressure feeling around her head and other times she will have more migrainous features such as unilateral throbbing.  She gets nauseous with her headaches but almost never vomits.  No preceding auras.  She will typically take over-the-counter medications and try to sleep.  This morning she awoke with a headache that was more severe than normal but felt similar to previous.  No vomiting or confusion.  No fevers.  She denies any dental problems or sinus pressure or congestion.  She states that her doctor is treating her high blood pressure expecting that her headaches will improve with treatment.  She has never seen a neurologist.  No neck pain or pain with movement of her neck. Patient denies signs of stroke including: facial droop, slurred speech, aphasia, weakness/numbness in extremities, imbalance/trouble walking.  Patient also complains of itching in her vaginal area.  She states that this typically happens when she develops bacterial vaginosis.  She has noted a small amount of white discharge.  She states that she uses protection (condoms).  She has been using a new detergent.  No treatments for this -- she states that metronidazole typically works. .      Past Medical History:  Diagnosis Date  . Gallstones 07/2015   Pt cancelled lap chole planned for 9/24  . Hypertension   . Obesity    220 #, BMI 36 07/2014    Patient Active Problem List   Diagnosis Date Noted  . RUQ pain 08/12/2014  . Abnormal  liver enzymes 08/12/2014  . Cholecystitis 08/11/2014  . Cholelithiasis with choledocholithiasis 08/11/2014    Past Surgical History:  Procedure Laterality Date  . CESAREAN SECTION    . ERCP N/A 08/12/2014   Procedure: ENDOSCOPIC RETROGRADE CHOLANGIOPANCREATOGRAPHY (ERCP);  Surgeon: Meryl Dare, MD;  Location: [facility];  Service: Endoscopy;  Laterality: N/A;     OB History   No obstetric history on file.      Home Medications    Prior to Admission medications   Medication Sig Start Date End Date Taking? Authorizing Provider  acetaminophen (TYLENOL) 325 MG tablet Take 2 tablets (650 mg total) by mouth every 4 (four) hours as needed for mild pain, moderate pain, fever or headache. 08/13/14   Sherrie George, PA-C  acetaminophen-codeine 120-12 MG/5ML suspension Take 5 mLs by mouth every 6 (six) hours as needed for pain. 09/18/14   Teressa Lower, NP  albuterol (PROVENTIL HFA;VENTOLIN HFA) 108 (90 BASE) MCG/ACT inhaler Inhale 2 puffs into the lungs every 4 (four) hours as needed for wheezing. 09/29/11 08/11/14  Cathren Laine, MD  amoxicillin (AMOXIL) 500 MG capsule Take 1 capsule (500 mg total) by mouth 3 (three) times daily. 06/22/15   Hess, Nada Boozer, PA-C  fluticasone (FLONASE) 50 MCG/ACT nasal spray Place 2 sprays into both nostrils daily. 06/22/15   Hess, Nada Boozer, PA-C  nitrofurantoin, macrocrystal-monohydrate, (MACROBID) 100 MG capsule Take 1 capsule (100 mg total) by mouth 2 (two) times daily. 09/22/14   Santiago Glad, PA-C  ondansetron (ZOFRAN) 4 MG tablet Take 1 tablet (4 mg total) by mouth every 8 (eight) hours as needed for nausea. 08/13/14   Sherrie George, PA-C  ondansetron (ZOFRAN) 4 MG tablet Take 1 tablet (4 mg total) by mouth every 6 (six) hours. 09/21/14   Santiago Glad, PA-C  oxyCODONE-acetaminophen (PERCOCET/ROXICET) 5-325 MG per tablet Take 1-2 tablets by mouth every 6 (six) hours as needed for severe pain. 09/21/14   Santiago Glad, PA-C   oxyCODONE-acetaminophen (ROXICET) 5-325 MG per tablet Take 1-2 tablets by mouth every 4 (four) hours as needed for severe pain. 08/13/14   Sherrie George, PA-C    Family History Family History  Problem Relation Age of Onset  . Diabetes Mother   . Hypertension Mother   . Heart disease Father   . Breast cancer Sister     Social History Social History   Tobacco Use  . Smoking status: Never Smoker  . Smokeless tobacco: Never Used  Substance Use Topics  . Alcohol use: No  . Drug use: No     Allergies   Ibuprofen   Review of Systems Review of Systems  Constitutional: Negative for fever.  HENT: Negative for congestion, dental problem, rhinorrhea and sinus pressure.   Eyes: Negative for photophobia, discharge, redness and visual disturbance.  Respiratory: Negative for shortness of breath.   Cardiovascular: Negative for chest pain.  Gastrointestinal: Positive for nausea. Negative for vomiting.  Genitourinary: Positive for vaginal discharge. Negative for dysuria, frequency, pelvic pain and urgency.  Musculoskeletal: Negative for gait problem, neck pain and neck stiffness.  Skin: Negative for rash.  Neurological: Positive for headaches. Negative for syncope, speech difficulty, weakness, light-headedness and numbness.  Psychiatric/Behavioral: Negative for confusion.     Physical Exam Updated Vital Signs BP 137/89   Pulse 86   Temp 98.2 F (36.8 C) (Oral)   Resp 16   Ht 5\' 6"  (1.676 m)   Wt 106.6 kg   SpO2 98%   BMI 37.93 kg/m   Physical Exam Vitals signs and nursing note reviewed. Exam conducted with a chaperone present.  Constitutional:      Appearance: She is well-developed.  HENT:     Head: Normocephalic and atraumatic.     Right Ear: Tympanic membrane, ear canal and external ear normal.     Left Ear: Tympanic membrane, ear canal and external ear normal.     Nose: Nose normal.     Mouth/Throat:     Pharynx: Uvula midline.  Eyes:     General: Lids are  normal.     Extraocular Movements:     Right eye: No nystagmus.     Left eye: No nystagmus.     Conjunctiva/sclera: Conjunctivae normal.     Pupils: Pupils are equal, round, and reactive to light.  Neck:     Musculoskeletal: Normal range of motion and neck supple.  Cardiovascular:     Rate and Rhythm: Normal rate and regular rhythm.  Pulmonary:     Effort: Pulmonary effort is normal.     Breath sounds: Normal breath sounds.  Abdominal:     Palpations: Abdomen is soft.     Tenderness: There is no abdominal tenderness.  Genitourinary:    Exam position: Lithotomy position.     Pubic Area: No rash.      Labia:        Right: No rash.        Left: No rash.      Vagina: Vaginal discharge (scant, white) present.  Cervix: No cervical motion tenderness, discharge or lesion.     Uterus: Not tender.   Musculoskeletal:     Cervical back: She exhibits normal range of motion, no tenderness and no bony tenderness.  Skin:    General: Skin is warm and dry.  Neurological:     Mental Status: She is alert and oriented to person, place, and time.     GCS: GCS eye subscore is 4. GCS verbal subscore is 5. GCS motor subscore is 6.     Cranial Nerves: No cranial nerve deficit.     Sensory: No sensory deficit.     Coordination: Coordination normal.     Gait: Gait normal.     Deep Tendon Reflexes: Reflexes are normal and symmetric.      ED Treatments / Results  Labs (all labs ordered are listed, but only abnormal results are displayed) Labs Reviewed  WET PREP, GENITAL - Abnormal; Notable for the following components:      Result Value   Clue Cells Wet Prep HPF POC PRESENT (*)    WBC, Wet Prep HPF POC MANY (*)    All other components within normal limits  GC/CHLAMYDIA PROBE AMP ([place]) NOT AT [facility]    EKG None  Radiology No results found.  Procedures Procedures (including critical care time)  Medications Ordered in ED Medications - No data to display   Initial Impression  / Assessment and Plan / ED Course  I have reviewed the triage vital signs and the nursing notes.  Pertinent labs & imaging results that were available during my care of the patient were reviewed by me and considered in my medical decision making (see chart for details).        Patient seen and examined.   Vital signs reviewed and are as follows: BP 137/89   Pulse 86   Temp 98.2 F (36.8 C) (Oral)   Resp 16   Ht 5\' 6"  (1.676 m)   Wt 106.6 kg   SpO2 98%   BMI 37.93 kg/m   For headache: Patient declines IV medications at this point.  Will give prescription for Phenergan to use with nausea and to see if this helps her headaches.  We discussed at length, need for discussion with primary or neurologist regarding preventative medications for her near daily chronic headaches.  Discussed that she does not have any red flags today which would require any imaging.  For vaginal symptoms: Patient agrees to proceed with testing for STIs and wet prep to determine if other infection present.  1:14 PM pelvic exam performed with RN chaperone.  Clue cells noted on wet prep.  Will treat for bacterial vaginosis.  Patient discharged home.  Encourage PCP follow-up.  Given neurology referrals if desired.  Final Clinical Impressions(s) / ED Diagnoses   Final diagnoses:  Chronic nonintractable headache, unspecified headache type  Bacterial vaginosis   HA: Patient without high-risk features of headache including: sudden onset/thunderclap HA, no similar headache in past, altered mental status, accompanying seizure, headache with exertion, age > 1550, history of immunocompromise, neck or shoulder pain, fever, use of anticoagulation, family history of spontaneous SAH, concomitant drug use, toxic exposure.   Patient has a normal complete neurological exam, normal vital signs, normal level of consciousness, no signs of meningismus, is well-appearing/non-toxic appearing, no signs of trauma.  Imaging with  CT/MRI not indicated given history and physical exam findings.   No dangerous or life-threatening conditions suspected or identified by history, physical exam, and by  work-up. No indications for hospitalization identified.   Vaginitis: Symptoms consistent with previous episodes of bacterial vaginosis.  Exam shows clue cells, no other concerning findings today.   ED Discharge Orders    None       Brandilyn Nanninga, JosRenne Crigleria Poche 02/27/19 1318    Jacalyn Lefevre, MD 02/27/19 1326

## 2019-02-27 NOTE — ED Triage Notes (Signed)
Pt states headache for several weeks,  Saw pmd in February, started on blood pressure medication, pt reports b/p high even with medications.  Pt reports fatigue.

## 2019-02-27 NOTE — Discharge Instructions (Signed)
Please read and follow all provided instructions.  Your diagnoses today include:  1. Chronic nonintractable headache, unspecified headache type   2. Bacterial vaginosis     Tests performed today include:  Wet prep - shows signs of bacterial vaginosis  Vital signs. See below for your results today.   Medications:   Phenergan - medication for nausea  Take any prescribed medications only as directed.  Additional information:  Follow any educational materials contained in this packet.  You are having a headache. No specific cause was found today for your headache. It may have been a migraine or other cause of headache. Stress, anxiety, fatigue, and depression are common triggers for headaches.   Your headache today does not appear to be life-threatening or require hospitalization, but often the exact cause of headaches is not determined in the emergency department. Therefore, follow-up with your doctor is very important to find out what may have caused your headache and whether or not you need any further diagnostic testing or treatment.   Sometimes headaches can appear benign (not harmful), but then more serious symptoms can develop which should prompt an immediate re-evaluation by your doctor or the emergency department.  BE VERY CAREFUL not to take multiple medicines containing Tylenol (also called acetaminophen). Doing so can lead to an overdose which can damage your liver and cause liver failure and possibly death.   Follow-up instructions: Please follow-up with your primary care provider in the next 7 days for further evaluation of your symptoms.   Return instructions:   Please return to the Emergency Department if you experience worsening symptoms.  Return if the medications do not resolve your headache, if it recurs, or if you have multiple episodes of vomiting or cannot keep down fluids.  Return if you have a change from the usual headache.  RETURN IMMEDIATELY IF  you:  Develop a sudden, severe headache  Develop confusion or become poorly responsive or faint  Develop a fever above 100.30F or problem breathing  Have a change in speech, vision, swallowing, or understanding  Develop new weakness, numbness, tingling, incoordination in your arms or legs  Have a seizure  Please return if you have any other emergent concerns.  Additional Information:  Your vital signs today were: BP 137/89    Pulse 86    Temp 98.2 F (36.8 C) (Oral)    Resp 16    Ht 5\' 6"  (1.676 m)    Wt 106.6 kg    SpO2 98%    BMI 37.93 kg/m  If your blood pressure (BP) was elevated above 135/85 this visit, please have this repeated by your doctor within one month. --------------

## 2019-02-28 LAB — GC/CHLAMYDIA PROBE AMP (~~LOC~~) NOT AT ARMC
Chlamydia: NEGATIVE
Neisseria Gonorrhea: NEGATIVE

## 2019-05-26 ENCOUNTER — Encounter (HOSPITAL_BASED_OUTPATIENT_CLINIC_OR_DEPARTMENT_OTHER): Payer: Self-pay | Admitting: *Deleted

## 2019-05-26 ENCOUNTER — Emergency Department (HOSPITAL_BASED_OUTPATIENT_CLINIC_OR_DEPARTMENT_OTHER): Payer: 59

## 2019-05-26 ENCOUNTER — Emergency Department (HOSPITAL_BASED_OUTPATIENT_CLINIC_OR_DEPARTMENT_OTHER)
Admission: EM | Admit: 2019-05-26 | Discharge: 2019-05-26 | Disposition: A | Discharge status: Home / Self Care | Payer: 59 | Attending: Emergency Medicine | Admitting: Emergency Medicine

## 2019-05-26 ENCOUNTER — Other Ambulatory Visit: Payer: Self-pay

## 2019-05-26 DIAGNOSIS — I10 Essential (primary) hypertension: Secondary | ICD-10-CM | POA: Insufficient documentation

## 2019-05-26 DIAGNOSIS — N3000 Acute cystitis without hematuria: Secondary | ICD-10-CM | POA: Diagnosis not present

## 2019-05-26 DIAGNOSIS — Z79899 Other long term (current) drug therapy: Secondary | ICD-10-CM | POA: Insufficient documentation

## 2019-05-26 DIAGNOSIS — R51 Headache: Secondary | ICD-10-CM | POA: Diagnosis present

## 2019-05-26 LAB — URINALYSIS, ROUTINE W REFLEX MICROSCOPIC
Bilirubin Urine: NEGATIVE
Glucose, UA: NEGATIVE mg/dL
Ketones, ur: NEGATIVE mg/dL
Nitrite: NEGATIVE
Protein, ur: NEGATIVE mg/dL
Specific Gravity, Urine: 1.025 (ref 1.005–1.030)
pH: 6 (ref 5.0–8.0)

## 2019-05-26 LAB — CBC
HCT: 41.2 % (ref 36.0–46.0)
Hemoglobin: 11.8 g/dL — ABNORMAL LOW (ref 12.0–15.0)
MCH: 27.2 pg (ref 26.0–34.0)
MCHC: 28.6 g/dL — ABNORMAL LOW (ref 30.0–36.0)
MCV: 94.9 fL (ref 80.0–100.0)
Platelets: 232 10*3/uL (ref 150–400)
RBC: 4.34 MIL/uL (ref 3.87–5.11)
RDW: 14.3 % (ref 11.5–15.5)
WBC: 6.8 10*3/uL (ref 4.0–10.5)
nRBC: 0 % (ref 0.0–0.2)

## 2019-05-26 LAB — URINALYSIS, MICROSCOPIC (REFLEX)

## 2019-05-26 LAB — BASIC METABOLIC PANEL
Anion gap: 10 (ref 5–15)
BUN: 10 mg/dL (ref 6–20)
CO2: 21 mmol/L — ABNORMAL LOW (ref 22–32)
Calcium: 9 mg/dL (ref 8.9–10.3)
Chloride: 106 mmol/L (ref 98–111)
Creatinine, Ser: 0.64 mg/dL (ref 0.44–1.00)
GFR calc Af Amer: >60 mL/min (ref >60)
GFR calc non Af Amer: >60 mL/min (ref >60)
Glucose, Bld: 77 mg/dL (ref 70–99)
Potassium: 3.7 mmol/L (ref 3.5–5.1)
Sodium: 137 mmol/L (ref 135–145)

## 2019-05-26 LAB — PREGNANCY, URINE: Preg Test, Ur: NEGATIVE

## 2019-05-26 MED ORDER — CEPHALEXIN 500 MG PO CAPS: 500.0000 mg | ORAL_CAPSULE | Freq: Two times a day (BID) | ORAL | 0 refills | Status: DC

## 2019-05-26 MED ORDER — HYDROCHLOROTHIAZIDE 25 MG PO TABS: 25.0000 mg | ORAL_TABLET | Freq: Every day | ORAL | 0 refills | Status: AC

## 2019-05-26 NOTE — Discharge Instructions (Addendum)
Take the antibiotics as prescribed for the urinary tract infection.  Take the hydrochlorothiazide for your blood pressure.  Follow-up with a primary care doctor as planned for further evaluation.

## 2019-05-26 NOTE — ED Notes (Signed)
C/o high bp ,has not taken bp meds in 3 months, ha, radiating to arm and back pain x 1 week  Also increased urination

## 2019-05-26 NOTE — ED Notes (Signed)
Patient transported to CT 

## 2019-05-26 NOTE — ED Provider Notes (Signed)
[facility] EMERGENCY DEPARTMENT Provider Note   CSN: [id number] Arrival date & time: 05/26/19  1154    History   Chief Complaint Chief Complaint  Patient presents with  . Hypertension    HPI Simon RheinLatisha M Lehnen is a 33 y.o. female.     HPI Patient has a history of hypertension.  Patient is currently not on medications because she does not have a primary care doctor in the area.  She was actually scheduled to see a doctor today but when she described her symptoms they suggested she come to the ED.  Patient states she started developing a headache in the back of her head yesterday.  She thought it could be related to her blood pressure.  However she started having some numbness and tingling in her left arm as if her arm had fallen asleep.  Patient's mother had a history of stroke and had similar symptoms so the patient became concerned.  She does have a mild headache.  She is not have any nausea or vomiting.  She is not having any chest pain or shortness of breath.  Patient also has been having some urinary frequency. Past Medical History:  Diagnosis Date  . Gallstones 07/2015   Pt cancelled lap chole planned for 9/24  . Hypertension   . Obesity    220 #, BMI 36 07/2014    Patient Active Problem List   Diagnosis Date Noted  . RUQ pain 08/12/2014  . Abnormal liver enzymes 08/12/2014  . Cholecystitis 08/11/2014  . Cholelithiasis with choledocholithiasis 08/11/2014    Past Surgical History:  Procedure Laterality Date  . CESAREAN SECTION    . ERCP N/A 08/12/2014   Procedure: ENDOSCOPIC RETROGRADE CHOLANGIOPANCREATOGRAPHY (ERCP);  Surgeon: Meryl DareMalcolm T Stark, MD;  Location: [facility];  Service: Endoscopy;  Laterality: N/A;     OB History   No obstetric history on file.      Home Medications    Prior to Admission medications   Medication Sig Start Date End Date Taking? Authorizing Provider  acetaminophen (TYLENOL) 325 MG tablet Take 2 tablets (650 mg total) by  mouth every 4 (four) hours as needed for mild pain, moderate pain, fever or headache. 08/13/14   Sherrie GeorgeJennings, Willard, PA-C  acetaminophen-codeine 120-12 MG/5ML suspension Take 5 mLs by mouth every 6 (six) hours as needed for pain. 09/18/14   Teressa LowerPickering, Vrinda, NP  albuterol (PROVENTIL HFA;VENTOLIN HFA) 108 (90 BASE) MCG/ACT inhaler Inhale 2 puffs into the lungs every 4 (four) hours as needed for wheezing. 09/29/11 08/11/14  Cathren LaineSteinl, Kevin, MD  amoxicillin (AMOXIL) 500 MG capsule Take 1 capsule (500 mg total) by mouth 3 (three) times daily. 06/22/15   Hess, Nada Boozerobyn M, PA-C  cephALEXin (KEFLEX) 500 MG capsule Take 1 capsule (500 mg total) by mouth 2 (two) times daily. 05/26/19   Linwood DibblesKnapp, Edrees Valent, MD  fluticasone (FLONASE) 50 MCG/ACT nasal spray Place 2 sprays into both nostrils daily. 06/22/15   Hess, Nada Boozerobyn M, PA-C  hydrochlorothiazide (HYDRODIURIL) 25 MG tablet Take 1 tablet (25 mg total) by mouth daily. 05/26/19   Linwood DibblesKnapp, Jayde Daffin, MD  metroNIDAZOLE (FLAGYL) 500 MG tablet Take 1 tablet (500 mg total) by mouth 2 (two) times daily. 02/27/19   Renne CriglerGeiple, Joshua, PA-C  nitrofurantoin, macrocrystal-monohydrate, (MACROBID) 100 MG capsule Take 1 capsule (100 mg total) by mouth 2 (two) times daily. 09/22/14   Santiago GladLaisure, Heather, PA-C  ondansetron (ZOFRAN) 4 MG tablet Take 1 tablet (4 mg total) by mouth every 8 (eight) hours as needed for nausea.  08/13/14   Sherrie GeorgeJennings, Willard, PA-C  ondansetron (ZOFRAN) 4 MG tablet Take 1 tablet (4 mg total) by mouth every 6 (six) hours. 09/21/14   Santiago GladLaisure, Heather, PA-C  oxyCODONE-acetaminophen (PERCOCET/ROXICET) 5-325 MG per tablet Take 1-2 tablets by mouth every 6 (six) hours as needed for severe pain. 09/21/14   Santiago GladLaisure, Heather, PA-C  oxyCODONE-acetaminophen (ROXICET) 5-325 MG per tablet Take 1-2 tablets by mouth every 4 (four) hours as needed for severe pain. 08/13/14   Sherrie GeorgeJennings, Willard, PA-C  promethazine (PHENERGAN) 25 MG tablet Take 1 tablet (25 mg total) by mouth every 6 (six) hours as needed for  nausea or vomiting. 02/27/19   Renne CriglerGeiple, Joshua, PA-C    Family History Family History  Problem Relation Age of Onset  . Diabetes Mother   . Hypertension Mother   . Heart disease Father   . Breast cancer Sister     Social History Social History   Tobacco Use  . Smoking status: Never Smoker  . Smokeless tobacco: Never Used  Substance Use Topics  . Alcohol use: No  . Drug use: No     Allergies   Ibuprofen   Review of Systems Review of Systems  All other systems reviewed and are negative.    Physical Exam Updated Vital Signs BP (!) 141/96 (BP Location: Left Arm)   Pulse 84   Temp 98.4 F (36.9 C) (Oral)   Resp 18   Ht 1.651 m (5\' 5" )   Wt 112.9 kg   LMP 05/11/2019   SpO2 99%   BMI 41.44 kg/m   Physical Exam Vitals signs and nursing note reviewed.  Constitutional:      General: She is not in acute distress.    Appearance: She is well-developed.  HENT:     Head: Normocephalic and atraumatic.     Right Ear: External ear normal.     Left Ear: External ear normal.  Eyes:     General: No scleral icterus.       Right eye: No discharge.        Left eye: No discharge.     Conjunctiva/sclera: Conjunctivae normal.  Neck:     Musculoskeletal: Neck supple.     Trachea: No tracheal deviation.  Cardiovascular:     Rate and Rhythm: Normal rate and regular rhythm.  Pulmonary:     Effort: Pulmonary effort is normal. No respiratory distress.     Breath sounds: Normal breath sounds. No stridor. No wheezing or rales.  Abdominal:     General: Bowel sounds are normal. There is no distension.     Palpations: Abdomen is soft.     Tenderness: There is no abdominal tenderness. There is no guarding or rebound.  Musculoskeletal:        General: No tenderness.  Skin:    General: Skin is warm and dry.     Findings: No rash.  Neurological:     Mental Status: She is alert and oriented to person, place, and time.     Cranial Nerves: No cranial nerve deficit (No facial droop,  extraocular movements intact, tongue midline ).     Sensory: No sensory deficit.     Motor: No abnormal muscle tone or seizure activity.     Coordination: Coordination normal.     Comments: No pronator drift bilateral upper extrem, able to hold both legs off bed for 5 seconds, sensation intact in all extremities, no visual field cuts, no left or right sided neglect, normal finger-nose exam bilaterally, no nystagmus  noted       ED Treatments / Results  Labs (all labs ordered are listed, but only abnormal results are displayed) Labs Reviewed  URINALYSIS, ROUTINE W REFLEX MICROSCOPIC - Abnormal; Notable for the following components:      Result Value   APPearance CLOUDY (*)    Hgb urine dipstick SMALL (*)    Leukocytes,Ua SMALL (*)    All other components within normal limits  CBC - Abnormal; Notable for the following components:   Hemoglobin 11.8 (*)    MCHC 28.6 (*)    All other components within normal limits  BASIC METABOLIC PANEL - Abnormal; Notable for the following components:   CO2 21 (*)    All other components within normal limits  URINALYSIS, MICROSCOPIC (REFLEX) - Abnormal; Notable for the following components:   Bacteria, UA FEW (*)    All other components within normal limits  URINE CULTURE  PREGNANCY, URINE    EKG EKG Interpretation  Date/Time:  Monday May 26 2019 12:14:06 EDT Ventricular Rate:  77 PR Interval:    QRS Duration: 82 QT Interval:  372 QTC Calculation: 421 R Axis:   18 Text Interpretation:  Sinus rhythm No old tracing to compare Confirmed by Dorie Rank [id number]) on 05/26/2019 12:21:39 PM   Radiology Ct Head Wo Contrast  Result Date: 05/26/2019 CLINICAL DATA:  Focal neural deficit EXAM: CT HEAD WITHOUT CONTRAST TECHNIQUE: Contiguous axial images were obtained from the base of the skull through the vertex without intravenous contrast. COMPARISON:  None. FINDINGS: Brain: No acute intracranial abnormality. Specifically, no hemorrhage,  hydrocephalus, mass lesion, acute infarction, or significant intracranial injury. Vascular: No hyperdense vessel or unexpected calcification. Skull: No acute calvarial abnormality. Sinuses/Orbits: Visualized paranasal sinuses and mastoids clear. Orbital soft tissues unremarkable. Other: None IMPRESSION: Atrophy, chronic microvascular disease. No acute intracranial abnormality. Electronically Signed   By: Rolm Baptise M.D.   On: 05/26/2019 13:02    Procedures Procedures (including critical care time)  Medications Ordered in ED Medications - No data to display   Initial Impression / Assessment and Plan / ED Course  I have reviewed the triage vital signs and the nursing notes.  Pertinent labs & imaging results that were available during my care of the patient were reviewed by me and considered in my medical decision making (see chart for details).  Clinical Course as of May 25 1444  Mon May 26, 2019  1313 Urinalysis suggestive of a urinary tract infection, squamous cell epithelial contamination noted however patient has having symptoms suggestive of UTI   [JK]    Clinical Course User Index [JK] Dorie Rank, MD     Patient presented to the ED for evaluation of hypertension and headache.  Patient complained of some numbness in left hand so a CT scan was performed.  No acute findings noted.  Patient's physical exam is reassuring.  Overall have a low suspicion for stroke or TIA.  Patient's laboratory tests are reassuring.  No signs of acute injury associated with her hypertension.  I will give the patient a prescription for hydrochlorothiazide until she can follow-up with a primary care doctor.  Patient did mention some urinary discomfort and her urinalysis does suggest UTI.  Plan on discharge home with a course of antibiotics.  Final Clinical Impressions(s) / ED Diagnoses   Final diagnoses:  Acute cystitis without hematuria  Hypertension, unspecified type    ED Discharge Orders          Ordered    hydrochlorothiazide (HYDRODIURIL)  25 MG tablet  Daily     05/26/19 1444    cephALEXin (KEFLEX) 500 MG capsule  2 times daily     05/26/19 1444           Linwood DibblesKnapp, Naje Rice, MD 05/26/19 1446

## 2019-05-26 NOTE — ED Triage Notes (Signed)
Pt c/o elevated BP-hx on noncompliant HTN-also c/o pain to upper back,left arm and nausea x "weeks"-NAD-steady gait

## 2019-05-27 LAB — URINE CULTURE

## 2019-09-18 ENCOUNTER — Emergency Department (HOSPITAL_BASED_OUTPATIENT_CLINIC_OR_DEPARTMENT_OTHER)
Admission: EM | Admit: 2019-09-18 | Discharge: 2019-09-18 | Disposition: A | Discharge status: Home / Self Care | Payer: 59 | Attending: Emergency Medicine | Admitting: Emergency Medicine

## 2019-09-18 ENCOUNTER — Encounter (HOSPITAL_BASED_OUTPATIENT_CLINIC_OR_DEPARTMENT_OTHER): Payer: Self-pay

## 2019-09-18 ENCOUNTER — Other Ambulatory Visit: Payer: Self-pay

## 2019-09-18 DIAGNOSIS — Z886 Allergy status to analgesic agent status: Secondary | ICD-10-CM | POA: Insufficient documentation

## 2019-09-18 DIAGNOSIS — F419 Anxiety disorder, unspecified: Secondary | ICD-10-CM

## 2019-09-18 DIAGNOSIS — I10 Essential (primary) hypertension: Secondary | ICD-10-CM | POA: Insufficient documentation

## 2019-09-18 DIAGNOSIS — R42 Dizziness and giddiness: Secondary | ICD-10-CM | POA: Insufficient documentation

## 2019-09-18 DIAGNOSIS — F329 Major depressive disorder, single episode, unspecified: Secondary | ICD-10-CM | POA: Insufficient documentation

## 2019-09-18 DIAGNOSIS — F32A Depression, unspecified: Secondary | ICD-10-CM

## 2019-09-18 DIAGNOSIS — R531 Weakness: Secondary | ICD-10-CM

## 2019-09-18 DIAGNOSIS — Z79899 Other long term (current) drug therapy: Secondary | ICD-10-CM | POA: Insufficient documentation

## 2019-09-18 LAB — TSH: TSH: 3.056 u[IU]/mL (ref 0.350–4.500)

## 2019-09-18 LAB — CBC
HCT: 37.8 % (ref 36.0–46.0)
Hemoglobin: 11.8 g/dL — ABNORMAL LOW (ref 12.0–15.0)
MCH: 27.5 pg (ref 26.0–34.0)
MCHC: 31.2 g/dL (ref 30.0–36.0)
MCV: 88.1 fL (ref 80.0–100.0)
Platelets: 244 10*3/uL (ref 150–400)
RBC: 4.29 MIL/uL (ref 3.87–5.11)
RDW: 14.3 % (ref 11.5–15.5)
WBC: 8.6 10*3/uL (ref 4.0–10.5)
nRBC: 0 % (ref 0.0–0.2)

## 2019-09-18 LAB — BASIC METABOLIC PANEL
Anion gap: 10 (ref 5–15)
BUN: 12 mg/dL (ref 6–20)
CO2: 24 mmol/L (ref 22–32)
Calcium: 9.6 mg/dL (ref 8.9–10.3)
Chloride: 105 mmol/L (ref 98–111)
Creatinine, Ser: 0.64 mg/dL (ref 0.44–1.00)
GFR calc Af Amer: >60 mL/min (ref >60)
GFR calc non Af Amer: >60 mL/min (ref >60)
Glucose, Bld: 92 mg/dL (ref 70–99)
Potassium: 3.5 mmol/L (ref 3.5–5.1)
Sodium: 139 mmol/L (ref 135–145)

## 2019-09-18 LAB — PREGNANCY, URINE: Preg Test, Ur: NEGATIVE

## 2019-09-18 MED ORDER — SODIUM CHLORIDE 0.9 % IV BOLUS
1000.0000 mL | Freq: Once | INTRAVENOUS | Status: AC
  Administered 2019-09-18: 1000 mL via INTRAVENOUS

## 2019-09-18 NOTE — ED Triage Notes (Signed)
Pt states feels "weird".  Headache, fatigue. Denies fevers, cough.  States blood pressure low lately. Has been followed by a physician in [place], recently moved here.

## 2019-09-18 NOTE — Discharge Instructions (Addendum)
You were seen in the emergency room for weakness in addition to fatigue, nausea, dizziness lightheadedness, anxiety and depression.  We obtained lab work which was reassuring.  Your exam was reassuring.  We have 1 outstanding lab that has not resulted.  We will call you if this result is abnormal.  We did not find anything abnormal in your urine or lab work, thus we think your symptoms are related to your worsening anxiety and depression.  We recommend you call your health insurance to look for coverage of primary care physicians and or psychologists or therapists for treatment.  We think you would benefit from therapy coupled with medication.  We are giving you a resource guide for mental health treatment.  We have also contacted our case manager who will reach out to you and help you get a primary care provider.  Is very important you obtain a primary care physician for management of this condition as well as for your blood pressure.

## 2019-09-18 NOTE — Progress Notes (Addendum)
TOC CM spoke to pt and states she prefers to have a clinic in [place]. Will arrange appt at [facility] or [facility]. Pt states she has [employer] and she can have a complete physical with that plan once per year. ED provided updated on referral. Jonnie Finner RN CCM, [facility] ED TOC CM [phone]  09/19/2019 2:40 pm Contacted pt with appt time at [facility] at 10/16/2019 at 930 am. States she has been stretching out her BP meds to last. States she does not have a refill. [place], [place] ED TOC CM [phone]

## 2019-09-18 NOTE — ED Provider Notes (Signed)
[facility] EMERGENCY DEPARTMENT Provider Note   CSN: [id number] Arrival date & time: 09/18/19  0740     History   Chief Complaint Chief Complaint  Patient presents with  . Fatigue    HPI Melinda Frost is a 33 y.o. female.     HPI Melinda Frost is a 33 year old female with a past medical history significant for hypertension who presents with 1 day of weakness in setting of recent increase in anxiety after a year of untreated depression since her son's father was murdered.  She reports her weakness began yesterday.  Nothing makes it better or worse.  She is also having some dizziness and feeling like she might pass out.  But she denies syncope.  She has also had decreased activity and decreased interest in activities she used to engage in.  She also reports a decreased appetite and then when she finally eats she will feel sick after.  She reports fatigue but denies chills and fever.  She reports years of intermittent abdominal pain.  She reports intermittent nausea and diarrhea without constipation or vomiting.  She reports being very anxious since the election and depressed since her son's father died.  Her mood changes have been accompanied by decreased concentration and insomnia.  She denies any thoughts of hurting herself.  Patient denies visual disturbances, shortness of breath, chest pain, rhinorrhea, sore throat, polydipsia, syncope, hair loss.  She does endorse some leg swelling at the end of the day.  Past Medical History:  Diagnosis Date  . Gallstones 07/2015   Pt cancelled lap chole planned for 9/24  . Hypertension   . Obesity    220 #, BMI 36 07/2014    Patient Active Problem List   Diagnosis Date Noted  . RUQ pain 08/12/2014  . Abnormal liver enzymes 08/12/2014  . Cholecystitis 08/11/2014  . Cholelithiasis with choledocholithiasis 08/11/2014    Past Surgical History:  Procedure Laterality Date  . CESAREAN SECTION    . ERCP N/A 08/12/2014   Procedure:  ENDOSCOPIC RETROGRADE CHOLANGIOPANCREATOGRAPHY (ERCP);  Surgeon: Ladene Artist, MD;  Location: [facility];  Service: Endoscopy;  Laterality: N/A;     OB History   No obstetric history on file.      Home Medications    Prior to Admission medications   Medication Sig Start Date End Date Taking? Authorizing Provider  acetaminophen (TYLENOL) 325 MG tablet Take 2 tablets (650 mg total) by mouth every 4 (four) hours as needed for mild pain, moderate pain, fever or headache. 08/13/14   Earnstine Regal, PA-C  acetaminophen-codeine 120-12 MG/5ML suspension Take 5 mLs by mouth every 6 (six) hours as needed for pain. 09/18/14   Glendell Docker, NP  albuterol (PROVENTIL HFA;VENTOLIN HFA) 108 (90 BASE) MCG/ACT inhaler Inhale 2 puffs into the lungs every 4 (four) hours as needed for wheezing. 09/29/11 08/11/14  Lajean Saver, MD  amoxicillin (AMOXIL) 500 MG capsule Take 1 capsule (500 mg total) by mouth 3 (three) times daily. 06/22/15   Hess, Hessie Diener, PA-C  cephALEXin (KEFLEX) 500 MG capsule Take 1 capsule (500 mg total) by mouth 2 (two) times daily. 05/26/19   Dorie Rank, MD  chlorthalidone (HYGROTON) 25 MG tablet Take by mouth.    [provider]  fluticasone (FLONASE) 50 MCG/ACT nasal spray Place 2 sprays into both nostrils daily. 06/22/15   Hess, Hessie Diener, PA-C  hydrochlorothiazide (HYDRODIURIL) 25 MG tablet Take 1 tablet (25 mg total) by mouth daily. 05/26/19   Dorie Rank, MD  metroNIDAZOLE (FLAGYL) 500 MG tablet Take 1 tablet (500 mg total) by mouth 2 (two) times daily. 02/27/19   Renne CriglerGeiple, Joshua, PA-C  nitrofurantoin, macrocrystal-monohydrate, (MACROBID) 100 MG capsule Take 1 capsule (100 mg total) by mouth 2 (two) times daily. 09/22/14   Santiago GladLaisure, Heather, PA-C  ondansetron (ZOFRAN) 4 MG tablet Take 1 tablet (4 mg total) by mouth every 8 (eight) hours as needed for nausea. 08/13/14   Sherrie GeorgeJennings, Willard, PA-C  ondansetron (ZOFRAN) 4 MG tablet Take 1 tablet (4 mg total) by mouth every 6 (six)  hours. 09/21/14   Santiago GladLaisure, Heather, PA-C  oxyCODONE-acetaminophen (PERCOCET/ROXICET) 5-325 MG per tablet Take 1-2 tablets by mouth every 6 (six) hours as needed for severe pain. 09/21/14   Santiago GladLaisure, Heather, PA-C  oxyCODONE-acetaminophen (ROXICET) 5-325 MG per tablet Take 1-2 tablets by mouth every 4 (four) hours as needed for severe pain. 08/13/14   Sherrie GeorgeJennings, Willard, PA-C  promethazine (PHENERGAN) 25 MG tablet Take 1 tablet (25 mg total) by mouth every 6 (six) hours as needed for nausea or vomiting. 02/27/19   Renne CriglerGeiple, Joshua, PA-C    Family History Family History  Problem Relation Age of Onset  . Diabetes Mother   . Hypertension Mother   . Heart disease Father   . Breast cancer Sister     Social History Social History   Tobacco Use  . Smoking status: Never Smoker  . Smokeless tobacco: Never Used  Substance Use Topics  . Alcohol use: No  . Drug use: No     Allergies   Ibuprofen   Review of Systems Review of Systems  Constitutional: Positive for activity change (decreased), appetite change (decreased) and fatigue. Negative for chills and fever.  HENT: Negative for rhinorrhea and sore throat.   Eyes: Negative for visual disturbance.  Respiratory: Negative for shortness of breath.   Cardiovascular: Positive for leg swelling. Negative for chest pain.  Gastrointestinal: Positive for abdominal pain (intermittent), diarrhea and nausea. Negative for constipation and vomiting.       Acid reflux  Endocrine: Negative for polydipsia.  Musculoskeletal: Positive for neck pain.       No falls  Skin:       No hair loss  Neurological: Positive for dizziness, weakness, light-headedness and headaches. Negative for syncope.  Psychiatric/Behavioral: Positive for decreased concentration, dysphoric mood and sleep disturbance. Negative for suicidal ideas. The patient is nervous/anxious.    Physical Exam Updated Vital Signs BP 131/90 (BP Location: Right Arm)   Pulse 78   Temp 98.2 F (36.8  C) (Oral)   Resp 16   Ht 5\' 5"  (1.651 m)   Wt 108.9 kg   SpO2 100%   BMI 39.94 kg/m   Physical Exam Vitals signs and nursing note reviewed.  Constitutional:      Appearance: Normal appearance. She is not ill-appearing.     Comments: Tearful female  HENT:     Head: Normocephalic and atraumatic.  Neck:     Musculoskeletal: Normal range of motion and neck supple.     Comments: No obvious thyroid enlargement or tenderness Cardiovascular:     Rate and Rhythm: Normal rate and regular rhythm.     Pulses: Normal pulses.     Heart sounds: Normal heart sounds. No murmur.  Pulmonary:     Effort: Pulmonary effort is normal. No respiratory distress.  Abdominal:     General: Bowel sounds are normal.     Palpations: Abdomen is soft.  Musculoskeletal: Normal range of motion.  General: No swelling.  Skin:    General: Skin is warm and dry.  Neurological:     General: No focal deficit present.     Mental Status: She is alert and oriented to person, place, and time.     Cranial Nerves: No cranial nerve deficit.  Psychiatric:     Comments: Dysmorphic mood, tearful     ED Treatments / Results  Labs (all labs ordered are listed, but only abnormal results are displayed) Labs Reviewed  CBC - Abnormal; Notable for the following components:      Result Value   Hemoglobin 11.8 (*)    All other components within normal limits  PREGNANCY, URINE  TSH  BASIC METABOLIC PANEL    EKG None  Radiology No results found.  Procedures Procedures (including critical care time)  Medications Ordered in ED Medications  sodium chloride 0.9 % bolus 1,000 mL (1,000 mLs Intravenous New Bag/Given 09/18/19 0845)     Initial Impression / Assessment and Plan / ED Course  I have reviewed the triage vital signs and the nursing notes.  Pertinent labs & imaging results that were available during my care of the patient were reviewed by me and considered in my medical decision making (see chart  for details).        Ms. Schiraldi is a 33 year old female with a past medical history significant for hypertension who presents with 1 day of weakness in setting of recent increase in anxiety after a year of untreated depression since her son's father was murdered with unremarkable vital signs and physical exam.  The patient's constellation of symptoms is concerning for pregnancy although the patient denies recent sexual activity and does not believe she could be pregnant.  Will obtain urine pregnancy to rule out.  Symptoms also somewhat consistent with hypothyroidism for which we will obtain a TSH.  Lab may not result before patient leaves, but patient will be called with results.  Symptoms also consistent with patient reported anxiety and depression for which we recommend outpatient follow-up with a primary care doctor and/or a psychologist or therapist.  Patient denies suicidal ideation.  Given vague symptoms, including weakness and dizziness, we will obtain CBC to screen for anemia and infection, and BMP to look for electrolyte abnormalities.  We will also give patient 1 L bolus of normal saline given patient reported decrease in p.o. intake and weakness, dizziness, lightheadedness and fatigue.   Will follow up lab work would likely plan to discharge with outpatient follow-up and depression.  Final Clinical Impressions(s) / ED Diagnoses   Final diagnoses:  Weakness  Anxiety  Depression, unspecified depression type  Lab work shows mild anemia at 11.8 consistent with last CBC from July.  Negative pregnancy.  BMP completely within normal limits.  We will discharge with PCP/psychology follow-up.  ED Discharge Orders    None       Jenell Milliner, MD 09/18/19 4098    Gwyneth Sprout, MD 09/18/19 1306

## 2019-10-16 ENCOUNTER — Inpatient Hospital Stay (INDEPENDENT_AMBULATORY_CARE_PROVIDER_SITE_OTHER): Payer: Medicaid Other | Admitting: Primary Care

## 2020-01-30 ENCOUNTER — Encounter (HOSPITAL_BASED_OUTPATIENT_CLINIC_OR_DEPARTMENT_OTHER): Payer: Self-pay | Admitting: Emergency Medicine

## 2020-01-30 ENCOUNTER — Emergency Department (HOSPITAL_BASED_OUTPATIENT_CLINIC_OR_DEPARTMENT_OTHER)
Admission: EM | Admit: 2020-01-30 | Discharge: 2020-01-30 | Discharge status: Against Medical Advice | Payer: Self-pay | Attending: Emergency Medicine | Admitting: Emergency Medicine

## 2020-01-30 ENCOUNTER — Other Ambulatory Visit: Payer: Self-pay

## 2020-01-30 ENCOUNTER — Emergency Department (HOSPITAL_BASED_OUTPATIENT_CLINIC_OR_DEPARTMENT_OTHER): Payer: Self-pay

## 2020-01-30 DIAGNOSIS — Z79899 Other long term (current) drug therapy: Secondary | ICD-10-CM | POA: Insufficient documentation

## 2020-01-30 DIAGNOSIS — R197 Diarrhea, unspecified: Secondary | ICD-10-CM | POA: Insufficient documentation

## 2020-01-30 DIAGNOSIS — Z5320 Procedure and treatment not carried out because of patient's decision for unspecified reasons: Secondary | ICD-10-CM | POA: Insufficient documentation

## 2020-01-30 DIAGNOSIS — R1084 Generalized abdominal pain: Secondary | ICD-10-CM | POA: Insufficient documentation

## 2020-01-30 DIAGNOSIS — I10 Essential (primary) hypertension: Secondary | ICD-10-CM | POA: Insufficient documentation

## 2020-01-30 LAB — URINALYSIS, ROUTINE W REFLEX MICROSCOPIC
Bilirubin Urine: NEGATIVE
Glucose, UA: NEGATIVE mg/dL
Ketones, ur: NEGATIVE mg/dL
Nitrite: NEGATIVE
Protein, ur: NEGATIVE mg/dL
Specific Gravity, Urine: 1.025 (ref 1.005–1.030)
pH: 6 (ref 5.0–8.0)

## 2020-01-30 LAB — PREGNANCY, URINE: Preg Test, Ur: NEGATIVE

## 2020-01-30 LAB — URINALYSIS, MICROSCOPIC (REFLEX): WBC, UA: >50 WBC/hpf (ref 0–5)

## 2020-01-30 MED ORDER — SODIUM CHLORIDE 0.9% FLUSH
3.0000 mL | Freq: Once | INTRAVENOUS | Status: DC
  Filled 2020-01-30: qty 3

## 2020-01-30 MED ORDER — ONDANSETRON HCL 4 MG/2ML IJ SOLN: 4.0000 mg | Freq: Once | INTRAMUSCULAR | Status: DC

## 2020-01-30 MED ORDER — SODIUM CHLORIDE 0.9 % IV BOLUS: 1000.0000 mL | Freq: Once | INTRAVENOUS | Status: DC

## 2020-01-30 MED ORDER — MORPHINE SULFATE (PF) 4 MG/ML IV SOLN: 4.0000 mg | Freq: Once | INTRAVENOUS | Status: DC

## 2020-01-30 NOTE — ED Provider Notes (Signed)
[facility] Provider Note   CSN: [id number] Arrival date & time: 01/30/20  1019    History Chief Complaint  Patient presents with  . Abdominal Pain    Melinda Frost is a 34 y.o. female with past medical history significant for cholelithiasis, hypertension, obesity who presents for evaluation of abdominal pain.  Generalized pain which began this morning.  Patient states she had one episode of diarrhea this morning without melena or bright red blood per rectum.  States there were "white things in my poop."  She denies any rectal pruritus.  She does have contact with her younger child however he is not exhibiting any diarrhea, rashes, lesions or pruritus as well.  Patient was unsure if these look like worms.  She denies fever, chills, nausea, vomiting, chest pain, shortness of breath, dysuria.  She denies aggravating or alleviating factors.  She is not take anything for symptoms.  History obtained from patient and past medical records. No interpreter used.  HPI     Past Medical History:  Diagnosis Date  . Gallstones 07/2015   Pt cancelled lap chole planned for 9/24  . Hypertension   . Obesity    220 #, BMI 36 07/2014    Patient Active Problem List   Diagnosis Date Noted  . RUQ pain 08/12/2014  . Abnormal liver enzymes 08/12/2014  . Cholecystitis 08/11/2014  . Cholelithiasis with choledocholithiasis 08/11/2014    Past Surgical History:  Procedure Laterality Date  . CESAREAN SECTION    . ERCP N/A 08/12/2014   Procedure: ENDOSCOPIC RETROGRADE CHOLANGIOPANCREATOGRAPHY (ERCP);  Surgeon: Ladene Artist, MD;  Location: [facility];  Service: Endoscopy;  Laterality: N/A;     OB History   No obstetric history on file.     Family History  Problem Relation Age of Onset  . Diabetes Mother   . Hypertension Mother   . Heart disease Father   . Breast cancer Sister     Social History   Tobacco Use  . Smoking status: Never Smoker  . Smokeless  tobacco: Never Used  Substance Use Topics  . Alcohol use: No  . Drug use: No    Home Medications Prior to Admission medications   Medication Sig Start Date End Date Taking? Authorizing Provider  acetaminophen (TYLENOL) 325 MG tablet Take 2 tablets (650 mg total) by mouth every 4 (four) hours as needed for mild pain, moderate pain, fever or headache. 08/13/14   Earnstine Regal, PA-C  acetaminophen-codeine 120-12 MG/5ML suspension Take 5 mLs by mouth every 6 (six) hours as needed for pain. 09/18/14   Glendell Docker, NP  albuterol (PROVENTIL HFA;VENTOLIN HFA) 108 (90 BASE) MCG/ACT inhaler Inhale 2 puffs into the lungs every 4 (four) hours as needed for wheezing. 09/29/11 08/11/14  Lajean Saver, MD  amoxicillin (AMOXIL) 500 MG capsule Take 1 capsule (500 mg total) by mouth 3 (three) times daily. 06/22/15   Hess, Hessie Diener, PA-C  cephALEXin (KEFLEX) 500 MG capsule Take 1 capsule (500 mg total) by mouth 2 (two) times daily. 05/26/19   Dorie Rank, MD  chlorthalidone (HYGROTON) 25 MG tablet Take by mouth.    [provider]  fluticasone (FLONASE) 50 MCG/ACT nasal spray Place 2 sprays into both nostrils daily. 06/22/15   Hess, Hessie Diener, PA-C  hydrochlorothiazide (HYDRODIURIL) 25 MG tablet Take 1 tablet (25 mg total) by mouth daily. 05/26/19   Dorie Rank, MD  metroNIDAZOLE (FLAGYL) 500 MG tablet Take 1 tablet (500 mg total) by mouth 2 (  two) times daily. 02/27/19   Renne Crigler, PA-C  nitrofurantoin, macrocrystal-monohydrate, (MACROBID) 100 MG capsule Take 1 capsule (100 mg total) by mouth 2 (two) times daily. 09/22/14   Santiago Glad, PA-C  ondansetron (ZOFRAN) 4 MG tablet Take 1 tablet (4 mg total) by mouth every 8 (eight) hours as needed for nausea. 08/13/14   Sherrie George, PA-C  ondansetron (ZOFRAN) 4 MG tablet Take 1 tablet (4 mg total) by mouth every 6 (six) hours. 09/21/14   Santiago Glad, PA-C  oxyCODONE-acetaminophen (PERCOCET/ROXICET) 5-325 MG per tablet Take 1-2 tablets by mouth  every 6 (six) hours as needed for severe pain. 09/21/14   Santiago Glad, PA-C  oxyCODONE-acetaminophen (ROXICET) 5-325 MG per tablet Take 1-2 tablets by mouth every 4 (four) hours as needed for severe pain. 08/13/14   Sherrie George, PA-C  promethazine (PHENERGAN) 25 MG tablet Take 1 tablet (25 mg total) by mouth every 6 (six) hours as needed for nausea or vomiting. 02/27/19   Renne Crigler, PA-C    Allergies    Ibuprofen  Review of Systems   Review of Systems  Constitutional: Negative.   HENT: Negative.   Respiratory: Negative.   Cardiovascular: Negative.   Gastrointestinal: Positive for abdominal pain and diarrhea. Negative for abdominal distention, anal bleeding, blood in stool, constipation, nausea, rectal pain and vomiting.  Genitourinary: Negative.   Musculoskeletal: Negative.   Skin: Negative.   Neurological: Negative.   All other systems reviewed and are negative.   Physical Exam Updated Vital Signs BP (!) 135/93 (BP Location: Right Arm)   Pulse 94   Temp 99.1 F (37.3 C) (Oral)   Resp 18   LMP 01/23/2020   SpO2 99%   Physical Exam Vitals and nursing note reviewed.  Constitutional:      General: She is not in acute distress.    Appearance: She is well-developed. She is not ill-appearing, toxic-appearing or diaphoretic.  HENT:     Head: Normocephalic and atraumatic.     Mouth/Throat:     Mouth: Mucous membranes are moist.  Eyes:     Pupils: Pupils are equal, round, and reactive to light.  Cardiovascular:     Rate and Rhythm: Normal rate.     Heart sounds: Normal heart sounds.  Pulmonary:     Effort: Pulmonary effort is normal. No respiratory distress.     Breath sounds: Normal breath sounds.  Abdominal:     General: Bowel sounds are normal. There is no distension.     Palpations: Abdomen is soft.     Tenderness: There is generalized abdominal tenderness. There is no right CVA tenderness, left CVA tenderness, guarding or rebound. Negative signs include  Murphy's sign and McBurney's sign.     Hernia: No hernia is present.  Musculoskeletal:        General: Normal range of motion.     Cervical back: Normal range of motion.  Skin:    General: Skin is warm and dry.     Capillary Refill: Capillary refill takes less than 2 seconds.  Neurological:     Mental Status: She is alert.    ED Results / Procedures / Treatments   Labs (all labs ordered are listed, but only abnormal results are displayed) Labs Reviewed  PREGNANCY, URINE  URINALYSIS, ROUTINE W REFLEX MICROSCOPIC  LIPASE, BLOOD  COMPREHENSIVE METABOLIC PANEL  CBC    EKG None  Radiology No results found.  Procedures Procedures (including critical care time)  Medications Ordered in ED Medications  sodium chloride flush (  NS) 0.9 % injection 3 mL (has no administration in time range)  sodium chloride 0.9 % bolus 1,000 mL (has no administration in time range)  ondansetron (ZOFRAN) injection 4 mg (has no administration in time range)  morphine 4 MG/ML injection 4 mg (has no administration in time range)    ED Course  I have reviewed the triage vital signs and the nursing notes.  Pertinent labs & imaging results that were available during my care of the patient were reviewed by me and considered in my medical decision making (see chart for details).  34 year old female appears otherwise well presents for evaluation of abdominal pain.  Began this morning.  She is afebrile, nonseptic, non-ill-appearing.  1 episode of diarrhea with "white things" in her stool.  She has no rectal pruritus or rectal pain.  No contact with anyone with prior worms or any skin manifestations.  She is around her son however he has no issues at this time.  Heart and lungs clear.  She appears well-hydrated.  She been tolerating p.o. intake at home without difficulty.  No known history of diverticulitis or colitis.  Plan on labs, imaging, fluids and reassess  Nursing informing that patient is refusing IV  and labs.  I went to discuss with patient.  States she would consider labs however not IV placement.  Patient states that she needs to leave within 30 minutes.  I discussed with patient that work-up would likely not be completed within 30 minutes.  Patient requesting discharge home.  States she has to pick up her son at school.  Patient states she will return later for work-up is refusing any sort of work-up at this time.  Patient will leave AGAINST MEDICAL ADVICE.  We discussed the nature and purpose, risks and benefits, as well as, the alternatives of treatment. Time was given to allow the opportunity to ask questions and consider their options, and after the discussion, the patient decided to refuse the offerred treatment. The patient was informed that refusal could lead to, but was not limited to, death, permanent disability, or severe pain. If present, I asked the relatives or significant others to dissuade them without success. Prior to refusing, I determined that the patient had the capacity to make their decision and understood the consequences of that decision. After refusal, I made every reasonable opportunity to treat them to the best of my ability.  The patient was notified that they may return to the emergency department at any time for further treatment.       MDM Rules/Calculators/A&P                       Final Clinical Impression(s) / ED Diagnoses Final diagnoses:  Generalized abdominal pain    Rx / DC Orders ED Discharge Orders    None       Aarti Mankowski A, PA-C 01/30/20 1253    Linwood Dibbles, MD 01/30/20 1439

## 2020-01-30 NOTE — ED Triage Notes (Signed)
Pt presents with c/o abdominal pain that started today. No n/v/d.

## 2020-01-30 NOTE — ED Notes (Addendum)
Patient refuses IV access.  Informed her that  we will be giving an IVF and medicine thru an IV.  She stated, " I don't want an IV, it irritates me."   I will notify EDP.

## 2020-01-30 NOTE — ED Notes (Signed)
ED Provider at bedside. 

## 2020-01-30 NOTE — ED Notes (Signed)
Patient was on her phone while I was talking to her.  EDP notified of patient's refusal for an IV and she stated that she will talk to her.

## 2020-02-19 ENCOUNTER — Emergency Department (HOSPITAL_BASED_OUTPATIENT_CLINIC_OR_DEPARTMENT_OTHER)
Admission: EM | Admit: 2020-02-19 | Discharge: 2020-02-19 | Disposition: A | Discharge status: Home / Self Care | Payer: Medicaid Other | Attending: Emergency Medicine | Admitting: Emergency Medicine

## 2020-02-19 ENCOUNTER — Other Ambulatory Visit: Payer: Self-pay

## 2020-02-19 ENCOUNTER — Encounter (HOSPITAL_BASED_OUTPATIENT_CLINIC_OR_DEPARTMENT_OTHER): Payer: Self-pay | Admitting: Emergency Medicine

## 2020-02-19 DIAGNOSIS — R42 Dizziness and giddiness: Secondary | ICD-10-CM | POA: Insufficient documentation

## 2020-02-19 DIAGNOSIS — I10 Essential (primary) hypertension: Secondary | ICD-10-CM | POA: Insufficient documentation

## 2020-02-19 DIAGNOSIS — N939 Abnormal uterine and vaginal bleeding, unspecified: Secondary | ICD-10-CM

## 2020-02-19 DIAGNOSIS — Z79899 Other long term (current) drug therapy: Secondary | ICD-10-CM | POA: Insufficient documentation

## 2020-02-19 LAB — CBC WITH DIFFERENTIAL/PLATELET
Abs Immature Granulocytes: 0.02 10*3/uL (ref 0.00–0.07)
Basophils Absolute: 0 10*3/uL (ref 0.0–0.1)
Basophils Relative: 0 %
Eosinophils Absolute: 0 10*3/uL (ref 0.0–0.5)
Eosinophils Relative: 0 %
HCT: 35.3 % — ABNORMAL LOW (ref 36.0–46.0)
Hemoglobin: 10.8 g/dL — ABNORMAL LOW (ref 12.0–15.0)
Immature Granulocytes: 0 %
Lymphocytes Relative: 21 %
Lymphs Abs: 1.6 10*3/uL (ref 0.7–4.0)
MCH: 26.4 pg (ref 26.0–34.0)
MCHC: 30.6 g/dL (ref 30.0–36.0)
MCV: 86.3 fL (ref 80.0–100.0)
Monocytes Absolute: 0.4 10*3/uL (ref 0.1–1.0)
Monocytes Relative: 5 %
Neutro Abs: 5.6 10*3/uL (ref 1.7–7.7)
Neutrophils Relative %: 74 %
Platelets: 271 10*3/uL (ref 150–400)
RBC: 4.09 MIL/uL (ref 3.87–5.11)
RDW: 14.4 % (ref 11.5–15.5)
WBC: 7.6 10*3/uL (ref 4.0–10.5)
nRBC: 0 % (ref 0.0–0.2)

## 2020-02-19 LAB — URINALYSIS, MICROSCOPIC (REFLEX): RBC / HPF: >50 RBC/hpf (ref 0–5)

## 2020-02-19 LAB — BASIC METABOLIC PANEL
Anion gap: 9 (ref 5–15)
BUN: 8 mg/dL (ref 6–20)
CO2: 23 mmol/L (ref 22–32)
Calcium: 8.9 mg/dL (ref 8.9–10.3)
Chloride: 105 mmol/L (ref 98–111)
Creatinine, Ser: 0.62 mg/dL (ref 0.44–1.00)
GFR calc Af Amer: >60 mL/min (ref >60)
GFR calc non Af Amer: >60 mL/min (ref >60)
Glucose, Bld: 87 mg/dL (ref 70–99)
Potassium: 3.4 mmol/L — ABNORMAL LOW (ref 3.5–5.1)
Sodium: 137 mmol/L (ref 135–145)

## 2020-02-19 LAB — URINALYSIS, ROUTINE W REFLEX MICROSCOPIC: Hgb urine dipstick: NEGATIVE

## 2020-02-19 LAB — PREGNANCY, URINE: Preg Test, Ur: NEGATIVE

## 2020-02-19 MED ORDER — NORGESTIMATE-ETH ESTRADIOL 0.25-35 MG-MCG PO TABS: ORAL_TABLET | ORAL | 1 refills | Status: DC

## 2020-02-19 MED ORDER — ONDANSETRON 4 MG PO TBDP: 4.0000 mg | ORAL_TABLET | Freq: Three times a day (TID) | ORAL | 0 refills | Status: DC | PRN

## 2020-02-19 NOTE — ED Triage Notes (Signed)
Dizziness. Heavy vaginal bleeding x 2 days.

## 2020-02-19 NOTE — ED Notes (Signed)
Pt aware of need for urine specimen. 

## 2020-02-19 NOTE — Discharge Instructions (Signed)
Pick up medications and take as prescribed. Start with 1 tablet 3x per day x 3 days, then 1 tablet 2x per day x 3 days, then 1 tablet every day until you are seen by OBGYN (skip the placebo pills and go onto the next pack if necessary and continue taking 1 tablet per day)  Your OBGYN office will call to schedule an appointment for you

## 2020-02-19 NOTE — ED Provider Notes (Signed)
[facility] Provider Note   CSN: [id number] Arrival date & time: 02/19/20  1225     History Chief Complaint  Patient presents with  . Vaginal Bleeding    KALLYN DEMARCUS is a 34 y.o. female with PMHx HTN who presents to the ED today with complaint of heavy vaginal bleeding x 3 days.  She reports she began her menstrual cycle 3 days ago and has been heavier than normal.  She states that she has gone through 1 super tampon and 1 large maxi pad approximately every 2 hours.  She states that she called her OB/GYN who told her to come to the ED for further evaluation.  Patient states that she feels lightheaded as well.  She reports normal abdominal cramping and states she feels nauseated however has not vomited.  Is sexually active with 1 partner, reports she had STD testing 2 weeks ago and does not want a repeat pelvic exam/STD testing at this time. Pt not currently on birth control.  Reports last normal menstrual period prior to this 1 was approximately 28 days ago.  Patient denies fevers, chills, vomiting, urinary symptoms, diarrhea, pelvic pain, vaginal discharge, any other associated symptoms.   The history is provided by the patient.       Past Medical History:  Diagnosis Date  . Gallstones 07/2015   Pt cancelled lap chole planned for 9/24  . Hypertension   . Obesity    220 #, BMI 36 07/2014    Patient Active Problem List   Diagnosis Date Noted  . RUQ pain 08/12/2014  . Abnormal liver enzymes 08/12/2014  . Cholecystitis 08/11/2014  . Cholelithiasis with choledocholithiasis 08/11/2014    Past Surgical History:  Procedure Laterality Date  . CESAREAN SECTION    . ERCP N/A 08/12/2014   Procedure: ENDOSCOPIC RETROGRADE CHOLANGIOPANCREATOGRAPHY (ERCP);  Surgeon: Ladene Artist, MD;  Location: [facility];  Service: Endoscopy;  Laterality: N/A;     OB History   No obstetric history on file.     Family History  Problem Relation Age of Onset  .  Diabetes Mother   . Hypertension Mother   . Heart disease Father   . Breast cancer Sister     Social History   Tobacco Use  . Smoking status: Never Smoker  . Smokeless tobacco: Never Used  Substance Use Topics  . Alcohol use: No  . Drug use: No    Home Medications Prior to Admission medications   Medication Sig Start Date End Date Taking? Authorizing Provider  acetaminophen (TYLENOL) 325 MG tablet Take 2 tablets (650 mg total) by mouth every 4 (four) hours as needed for mild pain, moderate pain, fever or headache. 08/13/14   Earnstine Regal, PA-C  acetaminophen-codeine 120-12 MG/5ML suspension Take 5 mLs by mouth every 6 (six) hours as needed for pain. 09/18/14   Glendell Docker, NP  albuterol (PROVENTIL HFA;VENTOLIN HFA) 108 (90 BASE) MCG/ACT inhaler Inhale 2 puffs into the lungs every 4 (four) hours as needed for wheezing. 09/29/11 08/11/14  Lajean Saver, MD  amoxicillin (AMOXIL) 500 MG capsule Take 1 capsule (500 mg total) by mouth 3 (three) times daily. 06/22/15   Hess, Hessie Diener, PA-C  cephALEXin (KEFLEX) 500 MG capsule Take 1 capsule (500 mg total) by mouth 2 (two) times daily. 05/26/19   Dorie Rank, MD  chlorthalidone (HYGROTON) 25 MG tablet Take by mouth.    [provider]  fluticasone (FLONASE) 50 MCG/ACT nasal spray Place 2 sprays into both  nostrils daily. 06/22/15   Hess, Nada Boozer, PA-C  hydrochlorothiazide (HYDRODIURIL) 25 MG tablet Take 1 tablet (25 mg total) by mouth daily. 05/26/19   Linwood Dibbles, MD  metroNIDAZOLE (FLAGYL) 500 MG tablet Take 1 tablet (500 mg total) by mouth 2 (two) times daily. 02/27/19   Renne Crigler, PA-C  nitrofurantoin, macrocrystal-monohydrate, (MACROBID) 100 MG capsule Take 1 capsule (100 mg total) by mouth 2 (two) times daily. 09/22/14   Santiago Glad, PA-C  norgestimate-ethinyl estradiol (SPRINTEC 28) 0.25-35 MG-MCG tablet Take 1 tablet TID x 3 days, Take 1 tablet BID x 3 days, then take 1 tablet once daily (skip placebo) 02/19/20   Devin Foskey,  Wesam Gearhart, PA-C  ondansetron (ZOFRAN ODT) 4 MG disintegrating tablet Take 1 tablet (4 mg total) by mouth every 8 (eight) hours as needed for nausea or vomiting. 02/19/20   Jesaiah Fabiano, PA-C  ondansetron (ZOFRAN) 4 MG tablet Take 1 tablet (4 mg total) by mouth every 8 (eight) hours as needed for nausea. 08/13/14   Sherrie George, PA-C  ondansetron (ZOFRAN) 4 MG tablet Take 1 tablet (4 mg total) by mouth every 6 (six) hours. 09/21/14   Santiago Glad, PA-C  oxyCODONE-acetaminophen (PERCOCET/ROXICET) 5-325 MG per tablet Take 1-2 tablets by mouth every 6 (six) hours as needed for severe pain. 09/21/14   Santiago Glad, PA-C  oxyCODONE-acetaminophen (ROXICET) 5-325 MG per tablet Take 1-2 tablets by mouth every 4 (four) hours as needed for severe pain. 08/13/14   Sherrie George, PA-C  promethazine (PHENERGAN) 25 MG tablet Take 1 tablet (25 mg total) by mouth every 6 (six) hours as needed for nausea or vomiting. 02/27/19   Renne Crigler, PA-C    Allergies    Ibuprofen  Review of Systems   Review of Systems  Constitutional: Negative for chills and fever.  Gastrointestinal: Positive for nausea. Negative for abdominal pain, constipation, diarrhea and vomiting.  Genitourinary: Positive for vaginal bleeding. Negative for pelvic pain and vaginal discharge.  Neurological: Positive for light-headedness.  All other systems reviewed and are negative.   Physical Exam Updated Vital Signs BP (!) 150/104 (BP Location: Left Arm)   Pulse 91   Temp 98.4 F (36.9 C) (Oral)   Resp 18   Ht 5\' 4"  (1.626 m)   Wt 111.1 kg   LMP 01/23/2020   SpO2 99%   BMI 42.05 kg/m   Physical Exam Vitals and nursing note reviewed.  Constitutional:      Appearance: She is obese. She is not ill-appearing or diaphoretic.  HENT:     Head: Normocephalic and atraumatic.  Eyes:     Conjunctiva/sclera: Conjunctivae normal.  Cardiovascular:     Rate and Rhythm: Normal rate and regular rhythm.     Pulses: Normal  pulses.  Pulmonary:     Effort: Pulmonary effort is normal.     Breath sounds: Normal breath sounds. No wheezing, rhonchi or rales.  Abdominal:     Palpations: Abdomen is soft.     Tenderness: There is no abdominal tenderness. There is no right CVA tenderness, left CVA tenderness, guarding or rebound.  Genitourinary:    Comments: Chaperone present for exam. No rashes, lesions, or tenderness to external genitalia. No erythema, injury, or tenderness to vaginal mucosa. Moderate amount of blood in vault. No adnexal masses, tenderness, or fullness. No CMT, cervical friability, or discharge from cervical os. Cervical os is closed. Uterus non-deviated, mobile, nonTTP, and without enlargement.  Musculoskeletal:     Cervical back: Neck supple.  Skin:    General:  Skin is warm and dry.  Neurological:     Mental Status: She is alert.     ED Results / Procedures / Treatments   Labs (all labs ordered are listed, but only abnormal results are displayed) Labs Reviewed  URINALYSIS, ROUTINE W REFLEX MICROSCOPIC - Abnormal; Notable for the following components:      Result Value   Color, Urine RED (*)    APPearance TURBID (*)    Glucose, UA   (*)    Value: TEST NOT REPORTED DUE TO COLOR INTERFERENCE OF URINE PIGMENT   Bilirubin Urine   (*)    Value: TEST NOT REPORTED DUE TO COLOR INTERFERENCE OF URINE PIGMENT   Ketones, ur   (*)    Value: TEST NOT REPORTED DUE TO COLOR INTERFERENCE OF URINE PIGMENT   Protein, ur   (*)    Value: TEST NOT REPORTED DUE TO COLOR INTERFERENCE OF URINE PIGMENT   Nitrite   (*)    Value: TEST NOT REPORTED DUE TO COLOR INTERFERENCE OF URINE PIGMENT   Leukocytes,Ua   (*)    Value: TEST NOT REPORTED DUE TO COLOR INTERFERENCE OF URINE PIGMENT   All other components within normal limits  BASIC METABOLIC PANEL - Abnormal; Notable for the following components:   Potassium 3.4 (*)    All other components within normal limits  CBC WITH DIFFERENTIAL/PLATELET - Abnormal;  Notable for the following components:   Hemoglobin 10.8 (*)    HCT 35.3 (*)    All other components within normal limits  URINALYSIS, MICROSCOPIC (REFLEX) - Abnormal; Notable for the following components:   Bacteria, UA FEW (*)    All other components within normal limits  PREGNANCY, URINE    EKG None  Radiology No results found.  Procedures Procedures (including critical care time)  Medications Ordered in ED Medications - No data to display  ED Course  I have reviewed the triage vital signs and the nursing notes.  Pertinent labs & imaging results that were available during my care of the patient were reviewed by me and considered in my medical decision making (see chart for details).    MDM Rules/Calculators/A&P                      34 year old female who presents to the ED today complaining of heavy vaginal bleeding for the past 3 days.  Her menses 3 days ago and states this is atypical for her.  Going through 1 large plus tampon and 1 large maxi pad every 2 hours.  Complains of some lightheadedness as well. No chest pain, shortness of breath, syncope, blurry vision.  Obtain CBC, BMP and perform pelvic.  Patient at first declined pelvic exam as she recently had STD screening however after lengthy discussion and need to evaluate her office to see if she has hemorrhaging she agrees.   CBC without leukocytosis. Hgb 10.8 which is slightly decreased from baseline.  Hemoglobin  Date Value Ref Range Status  02/19/2020 10.8 (L) 12.0 - 15.0 g/dL Final  99/24/2683 41.9 (L) 12.0 - 15.0 g/dL Final  62/22/9798 92.1 (L) 12.0 - 15.0 g/dL Final  19/41/7408 14.4 (L) 12.0 - 15.0 g/dL Final   BMP with potasisum 3.4; have encouraged increasing potassium in diet.  U/A with too much blood to evaluate however urine preg neg  Pelvic exam with moderate amount of blood in vault however not hemorrhaging from Os. No adnexal or CMT. Will consult pt's OBGYN for further reccs  Discussed case with  Dr. Peyton Bottoms; pt's NP is out of town currently. He recommends high dose estrogen OCP taper as well as zofran. The office will call pt to schedule a follow up appt. Pt is in agreement with plan at this time. She does endorse increased stress as of late which could be contributing to pt's abnormal uterine bleeding. Pt stable for discharge home with close OBGYN follow up.   This note was prepared using Dragon voice recognition software and may include unintentional dictation errors due to the inherent limitations of voice recognition software.  Final Clinical Impression(s) / ED Diagnoses Final diagnoses:  Abnormal uterine bleeding    Rx / DC Orders ED Discharge Orders         Ordered    norgestimate-ethinyl estradiol (SPRINTEC 28) 0.25-35 MG-MCG tablet     02/19/20 1627    ondansetron (ZOFRAN ODT) 4 MG disintegrating tablet  Every 8 hours PRN     02/19/20 1627           Discharge Instructions     Pick up medications and take as prescribed. Start with 1 tablet 3x per day x 3 days, then 1 tablet 2x per day x 3 days, then 1 tablet every day until you are seen by OBGYN (skip the placebo pills and go onto the next pack if necessary and continue taking 1 tablet per day)  Your OBGYN office will call to schedule an appointment for you       Tanda Rockers, PA-C 02/19/20 1635    Little, Ambrose [place], MD 02/19/20 Paulo Fruit

## 2020-04-24 IMAGING — CT CT HEAD WITHOUT CONTRAST
3 series · 15 of 47 positions shown, 18 images · non-contrast
Comparison: None.

CLINICAL DATA: Focal neural deficit

EXAM:
CT HEAD WITHOUT CONTRAST
TECHNIQUE: Contiguous axial images were obtained from the base of the skull
through the vertex without intravenous contrast.

[Series 2: head wo · axial · 0.43mm/px · z∈[-180,-55]mm · 9 of 31 slices shown, 12 images]
[im 3/31  brain]
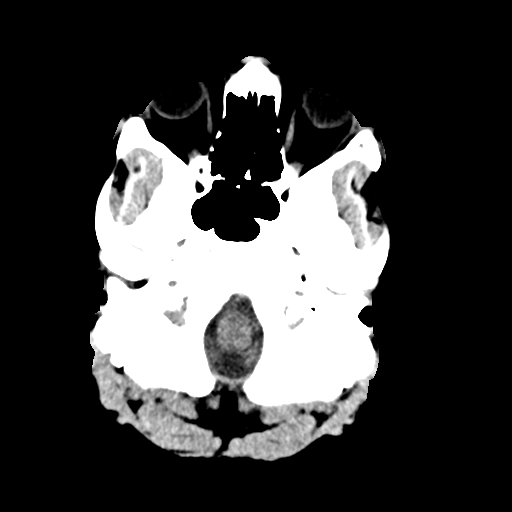
[im 3/31  bone]
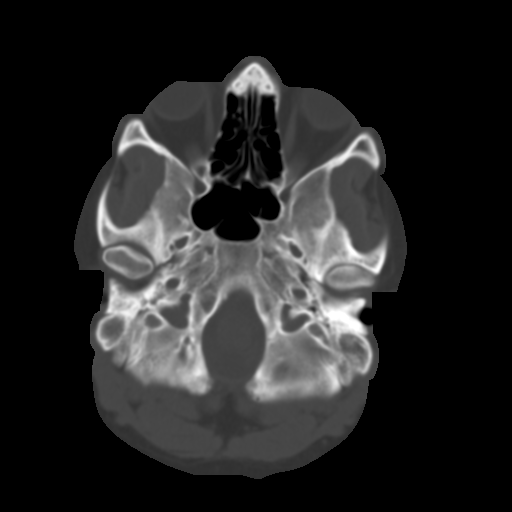
[im 6/31  brain]
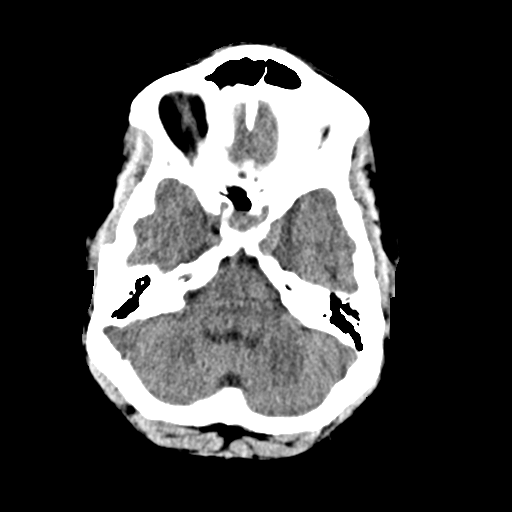
[im 9/31  brain]
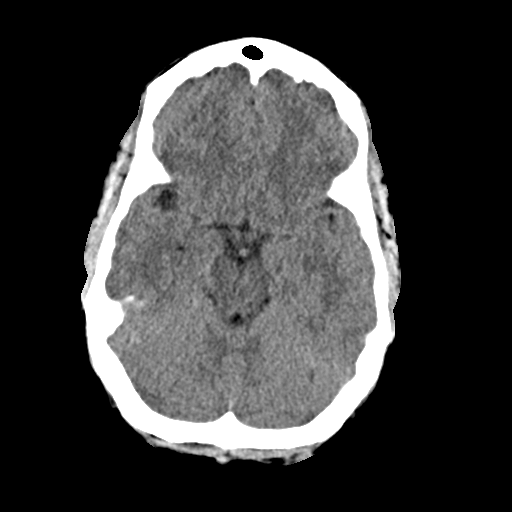
[im 12/31  brain]
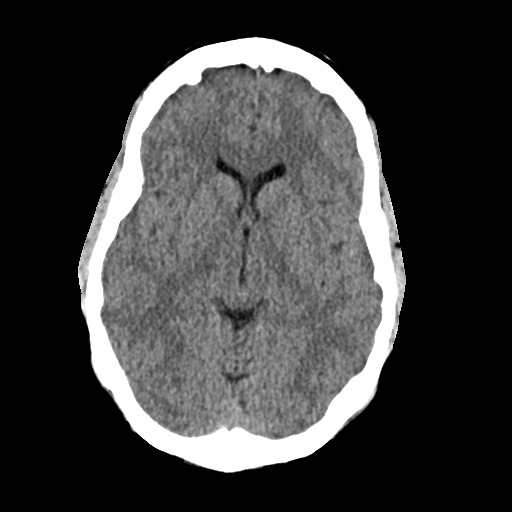
[im 16/31  brain]
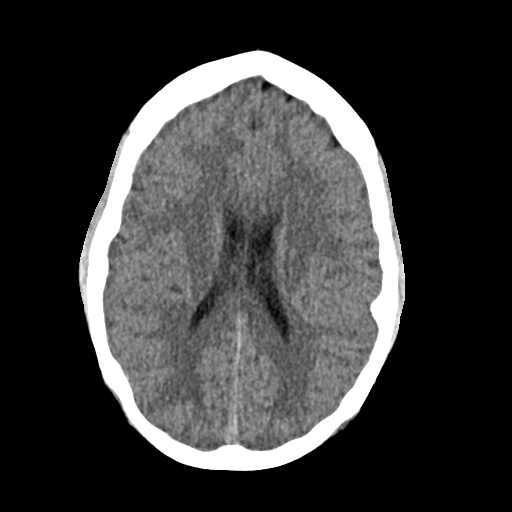
[im 16/31  bone]
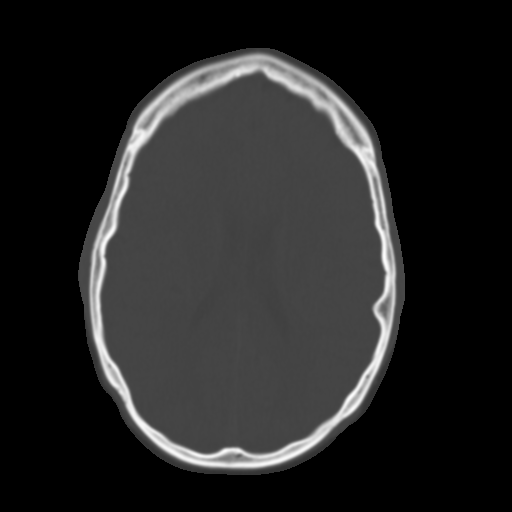
[im 19/31  brain]
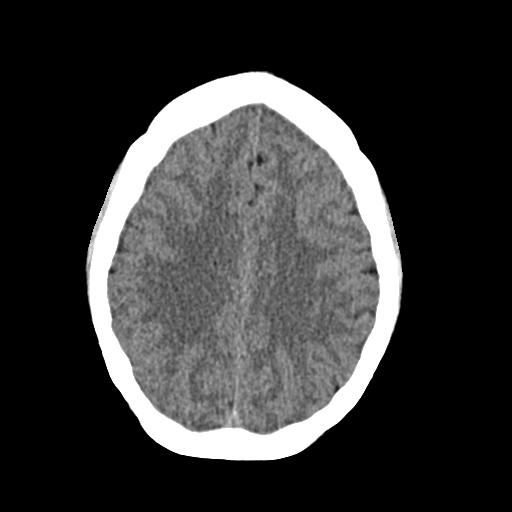
[im 22/31  brain]
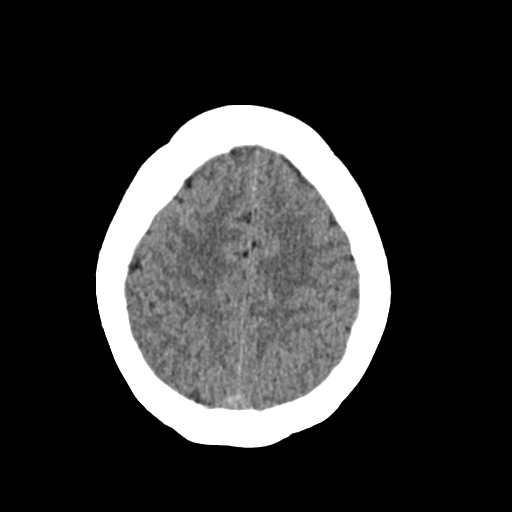
[im 25/31  brain]
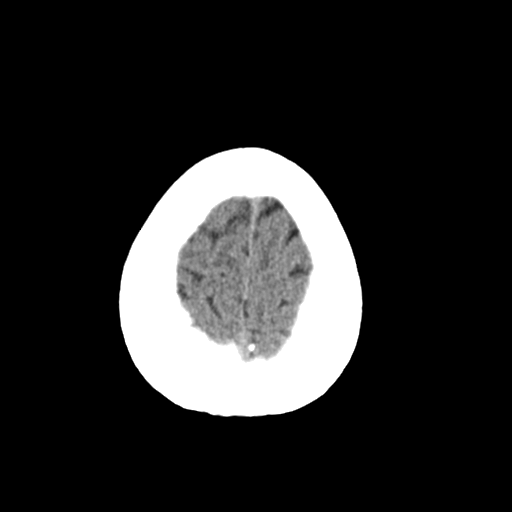
[im 28/31  brain]
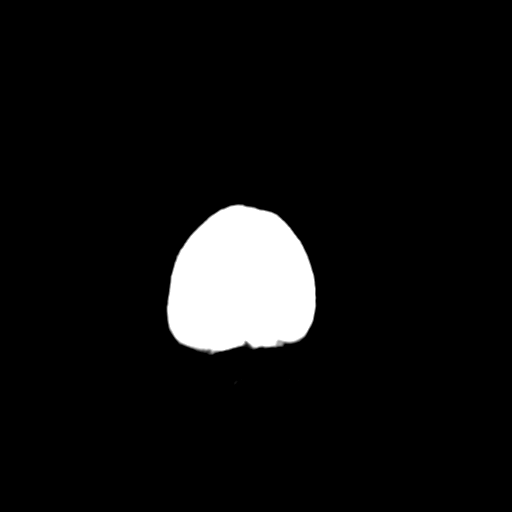
[im 28/31  bone]
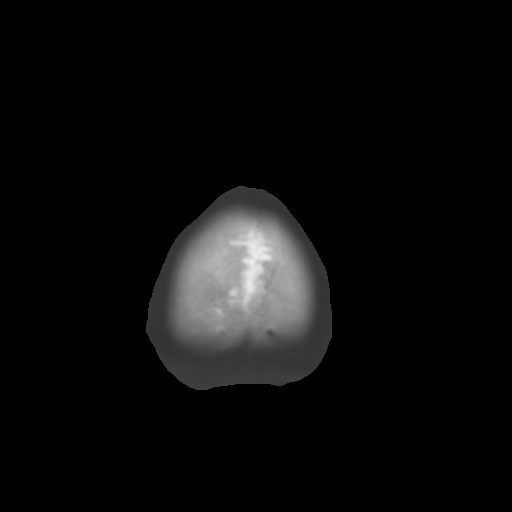

[Series 4: coronal soft · coronal · 0.30mm/px · 3 of 78 slices shown]
[im 26/78  brain]
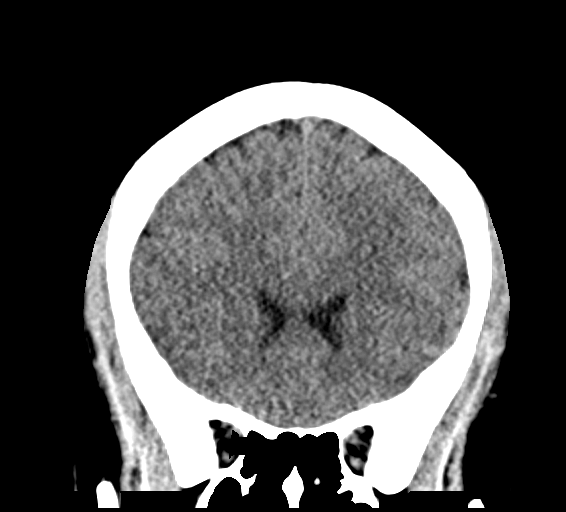
[im 35/78  brain]
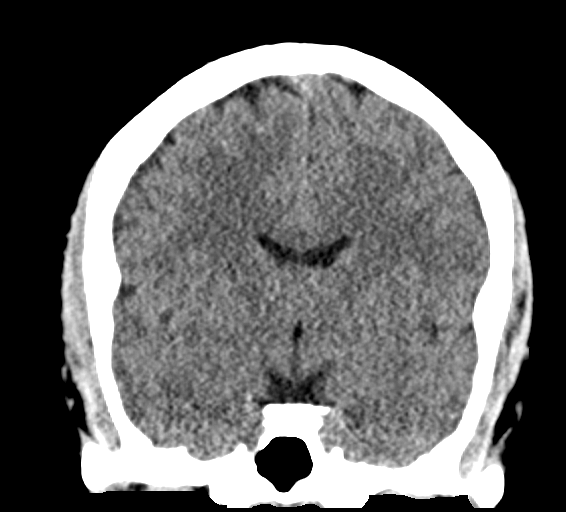
[im 43/78  brain]
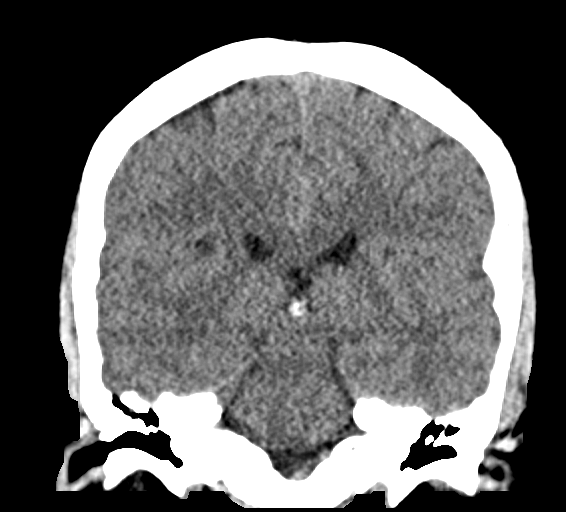

[Series 5: sag soft · sagittal · 0.30mm/px · 3 of 56 slices shown]
[im 19/56  brain]
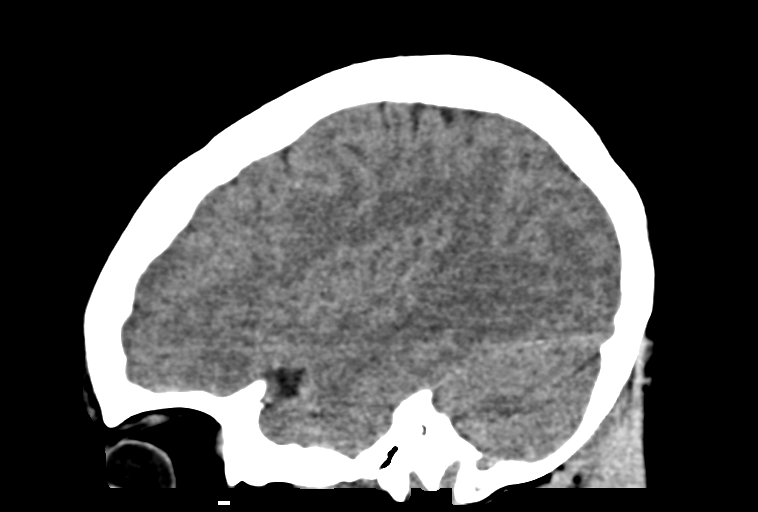
[im 28/56  brain]
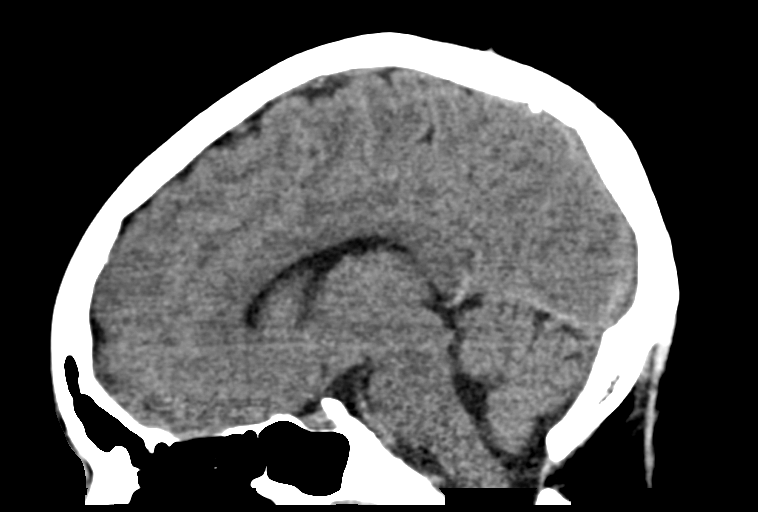
[im 37/56  brain]
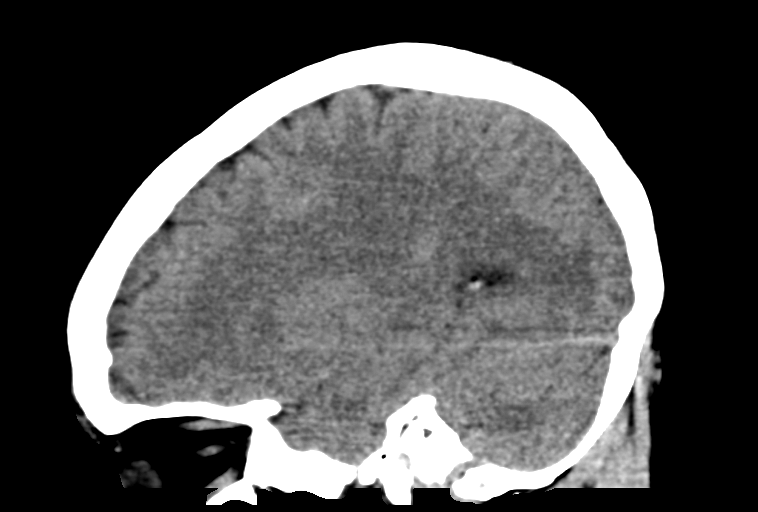

[15 of 47 positions shown; findings below may reference images not displayed]

FINDINGS: Brain: No acute intracranial abnormality. Specifically, no
hemorrhage, hydrocephalus, mass lesion, acute infarction, or
significant intracranial injury.

Vascular: No hyperdense vessel or unexpected calcification.

Skull: No acute calvarial abnormality.

Sinuses/Orbits: Visualized paranasal sinuses and mastoids clear.
Orbital soft tissues unremarkable.

Other: None
IMPRESSION: Atrophy, chronic microvascular disease.

No acute intracranial abnormality.

## 2021-11-11 ENCOUNTER — Other Ambulatory Visit: Payer: Self-pay

## 2021-11-11 ENCOUNTER — Encounter (HOSPITAL_BASED_OUTPATIENT_CLINIC_OR_DEPARTMENT_OTHER): Payer: Self-pay | Admitting: Plastic Surgery

## 2021-11-18 DIAGNOSIS — I10 Essential (primary) hypertension: Secondary | ICD-10-CM

## 2022-01-31 NOTE — Pre-Procedure Instructions (Addendum)
Surgical Instructions ? ? ? Your procedure is scheduled on Friday, April 7. ? Report to [facility] Main Entrance "A" at 5:30 A.M., then check in with the Admitting office. ? Call this number if you have problems the morning of surgery: ? [phone] ? ? If you have any questions prior to your surgery date call [phone]: Open Monday-Friday 8am-4pm ? ? ? Remember: ? Do not eat after midnight the night before your surgery ? ?You may drink clear liquids until 4:30AM the morning of your surgery.   ?Clear liquids allowed are: Water, Non-Citrus Juices (without pulp), Carbonated Beverages, Clear Tea, Black Coffee ONLY (NO MILK, CREAM OR POWDERED CREAMER of any kind), and Gatorade ?  ? Take these medicines the morning of surgery with A SIP OF WATER:  ?Meclizine (if needed) ? ?As 02/03/22, STOP taking any Aspirin (unless otherwise instructed by your surgeon) Aleve, Naproxen, Ibuprofen, Motrin, Advil, Goody's, BC's, all herbal medications, fish oil, and all vitamins. ? ?Please be ready to provide urine sample upon arriving to pre-op area on your day of surgery.  ?         ?Do not wear jewelry or makeup ?Do not wear lotions, powders, perfumes/colognes, or deodorant. ?Do not shave 48 hours prior to surgery.  Men may shave face and neck. ?Do not bring valuables to the hospital. ?Do not wear nail polish, gel polish, artificial nails, or any other type of covering on natural nails (fingers and toes) ?If you have artificial nails or gel coating that need to be removed by a nail salon, please have this removed prior to surgery. Artificial nails or gel coating may interfere with anesthesia's ability to adequately monitor your vital signs. ? ?[facility] is not responsible for any belongings or valuables. .  ? ?Do NOT Smoke (Tobacco/Vaping)  24 hours prior to your procedure ? ?If you use a CPAP at night, you may bring your mask for your overnight stay. ?  ?Contacts, glasses, hearing aids, dentures or partials may not be worn into  surgery, please bring cases for these belongings ?  ?For patients admitted to the hospital, discharge time will be determined by your treatment team. ?  ?Patients discharged the day of surgery will not be allowed to drive home, and someone needs to stay with them for 24 hours. ? ? ?SURGICAL WAITING ROOM VISITATION ?Patients having surgery or a procedure in a hospital may have two support people. ?Children under the age of 5 must have an adult with them who is not the patient. ?They may stay in the waiting area during the procedure and may switch out with other visitors. If the patient needs to stay at the hospital during part of their recovery, the visitor guidelines for inpatient rooms apply. ? ?Please refer to the [facility] website for the visitor guidelines for Inpatients (after your surgery is over and you are in a regular room).  ? ? ? ? ? ?Special instructions:   ? ?Oral Hygiene is also important to reduce your risk of infection.  Remember - BRUSH YOUR TEETH THE MORNING OF SURGERY WITH YOUR REGULAR TOOTHPASTE ? ? ?[facility]- Preparing For Surgery ? ?Before surgery, you can play an important role. Because skin is not sterile, your skin needs to be as free of germs as possible. You can reduce the number of germs on your skin by washing with CHG (chlorahexidine gluconate) Soap before surgery.  CHG is an antiseptic cleaner which kills germs and bonds with the skin to continue  killing germs even after washing.   ? ? ?Please do not use if you have an allergy to CHG or antibacterial soaps. If your skin becomes reddened/irritated stop using the CHG.  ?Do not shave (including legs and underarms) for at least 48 hours prior to first CHG shower. It is OK to shave your face. ? ?Please follow these instructions carefully. ?  ? ? Shower the NIGHT BEFORE SURGERY and the MORNING OF SURGERY with CHG Soap.  ? If you chose to wash your hair, wash your hair first as usual with your normal shampoo. After you shampoo, rinse  your hair and body thoroughly to remove the shampoo.  Then [employer] and genitals (private parts) with your normal soap and rinse thoroughly to remove soap. ? ?After that Use CHG Soap as you would any other liquid soap. You can apply CHG directly to the skin and wash gently with a scrungie or a clean washcloth.  ? ?Apply the CHG Soap to your body ONLY FROM THE NECK DOWN.  Do not use on open wounds or open sores. Avoid contact with your eyes, ears, mouth and genitals (private parts). Wash Face and genitals (private parts)  with your normal soap.  ? ?Wash thoroughly, paying special attention to the area where your surgery will be performed. ? ?Thoroughly rinse your body with warm water from the neck down. ? ?DO NOT shower/wash with your normal soap after using and rinsing off the CHG Soap. ? ?Pat yourself dry with a CLEAN TOWEL. ? ?Wear CLEAN PAJAMAS to bed the night before surgery ? ?Place CLEAN SHEETS on your bed the night before your surgery ? ?DO NOT SLEEP WITH PETS. ? ? ?Day of Surgery: ? ?Take a shower with CHG soap. ?Wear Clean/Comfortable clothing the morning of surgery ?Do not apply any deodorants/lotions.   ?Remember to brush your teeth WITH YOUR REGULAR TOOTHPASTE. ? ? ? ?If you received a COVID test during your pre-op visit  it is requested that you wear a mask when out in public, stay away from anyone that may not be feeling well and notify your surgeon if you develop symptoms. If you have been in contact with anyone that has tested positive in the last 10 days please notify you surgeon. ? ?  ?Please read over the following fact sheets that you were given.  ? ?

## 2022-02-01 ENCOUNTER — Encounter (HOSPITAL_COMMUNITY): Payer: Self-pay | Admitting: Physician Assistant

## 2022-02-01 ENCOUNTER — Encounter (HOSPITAL_COMMUNITY): Payer: Self-pay

## 2022-02-01 ENCOUNTER — Other Ambulatory Visit: Payer: Self-pay

## 2022-02-01 ENCOUNTER — Encounter (HOSPITAL_COMMUNITY)
Admission: RE | Admit: 2022-02-01 | Discharge: 2022-02-01 | Disposition: A | Discharge status: Home / Self Care | Payer: Medicaid Other | Source: Ambulatory Visit | Attending: Plastic Surgery | Admitting: Plastic Surgery

## 2022-02-01 VITALS — BP 126/85 | HR 66 | Temp 97.9°F | Resp 18 | Ht 65.0 in | Wt 245.3 lb

## 2022-02-01 DIAGNOSIS — Z01812 Encounter for preprocedural laboratory examination: Secondary | ICD-10-CM | POA: Diagnosis present

## 2022-02-01 DIAGNOSIS — M549 Dorsalgia, unspecified: Secondary | ICD-10-CM | POA: Diagnosis not present

## 2022-02-01 DIAGNOSIS — M542 Cervicalgia: Secondary | ICD-10-CM | POA: Insufficient documentation

## 2022-02-01 DIAGNOSIS — K219 Gastro-esophageal reflux disease without esophagitis: Secondary | ICD-10-CM | POA: Diagnosis not present

## 2022-02-01 DIAGNOSIS — N62 Hypertrophy of breast: Secondary | ICD-10-CM | POA: Diagnosis not present

## 2022-02-01 DIAGNOSIS — G8929 Other chronic pain: Secondary | ICD-10-CM | POA: Diagnosis not present

## 2022-02-01 DIAGNOSIS — I10 Essential (primary) hypertension: Secondary | ICD-10-CM | POA: Diagnosis not present

## 2022-02-01 DIAGNOSIS — L304 Erythema intertrigo: Secondary | ICD-10-CM | POA: Insufficient documentation

## 2022-02-01 DIAGNOSIS — Z01818 Encounter for other preprocedural examination: Secondary | ICD-10-CM

## 2022-02-01 HISTORY — DX: Anxiety disorder, unspecified: F41.9

## 2022-02-01 HISTORY — DX: Gastro-esophageal reflux disease without esophagitis: K21.9

## 2022-02-01 HISTORY — DX: Other complications of anesthesia, initial encounter: T88.59XA

## 2022-02-01 HISTORY — DX: Anemia, unspecified: D64.9

## 2022-02-01 LAB — BASIC METABOLIC PANEL
Anion gap: 6 (ref 5–15)
BUN: 10 mg/dL (ref 6–20)
CO2: 27 mmol/L (ref 22–32)
Calcium: 9.2 mg/dL (ref 8.9–10.3)
Chloride: 106 mmol/L (ref 98–111)
Creatinine, Ser: 0.75 mg/dL (ref 0.44–1.00)
GFR, Estimated: >60 mL/min (ref >60)
Glucose, Bld: 102 mg/dL — ABNORMAL HIGH (ref 70–99)
Potassium: 3.5 mmol/L (ref 3.5–5.1)
Sodium: 139 mmol/L (ref 135–145)

## 2022-02-01 LAB — CBC
HCT: 38.5 % (ref 36.0–46.0)
Hemoglobin: 12 g/dL (ref 12.0–15.0)
MCH: 27.6 pg (ref 26.0–34.0)
MCHC: 31.2 g/dL (ref 30.0–36.0)
MCV: 88.7 fL (ref 80.0–100.0)
Platelets: 256 10*3/uL (ref 150–400)
RBC: 4.34 MIL/uL (ref 3.87–5.11)
RDW: 14 % (ref 11.5–15.5)
WBC: 7.3 10*3/uL (ref 4.0–10.5)
nRBC: 0 % (ref 0.0–0.2)

## 2022-02-01 NOTE — H&P (Signed)
Subjective:  ?  ? Patient ID: Melinda Frost is a 36 y.o. female. ?  ?HPI ?  ?Returns for follow up discussion prior to planned breast reduction. Referred by friend that is a patient. Has had prior consultation with Dr. Doy Mince. Current DDD. Reports several year history neck and back pain. Reports difficulty with work due to sitting nature and constant pain. Has tried chiropractor visits, who also provided posture exercises without relief. Has tried specialty fitted bras, OTC pain medication and weight loss as below without relief for over 3 month trial. Also notes frequent migraines, cannot tolerate Topamax due to side effects. Notes frequent rashes and excoriation beneath breasts. This persists despite hygiene measures, topical hydrocortisone for over 3 month trial. ?  ?Wt stable over last 6 months. ?  ?MMG 12/2019 for per patient asymmetry breasts and itching. Focal asymmetry noted left periareolar lesion. Left breast US completed 12/2019 and 2021 with benign subareolar likely complicated cyst. Denies FH breast or ovarian ca. ?  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 Lives with 36yo. Has family in area to assist with post op care. ?  ?Review of Systems  ?Musculoskeletal: Positive for back pain and neck pain.  ?Skin: Positive for rash.  ?  ?Remainder 12 point review negative ?   ?Objective:  ? Physical Exam ?Cardiovascular:  ?   Rate and Rhythm: Normal rate and regular rhythm.  ?Pulmonary:  ?   Effort: Pulmonary effort is normal.  ?   Breath sounds: Normal breath sounds.  ?Skin: ?   Comments: Fitzpatrick 5   ?  ?+shoulder grooving ?Breasts: no masses ?Right> left volume ?Grade 3 ptosis bilateral ?SN to nipple R 39 L 40 cm ?BW R 23 L 23 cm ?Nipple to IMF R 15 L 17 cm ?  ?   ?Assessment:  ?   ?Macromastia ?Chronic neck and back pain ?Intertrigo ?Obesity ?   ?Plan:  ?   ?At her age, and in absence family history, do not require MMG prior to surgery. Reviewed screening guidelines, patient may elect to have  baseline MMG prior to reduction. ?  ?Chronic neck and back pain, intertrigo that has failed conservative measures in setting of macromastia. No other cause of back pain or rashes noted. There is a reasonable likelihood that the symptoms are primarily due to macromastia; breast reduction surgery has reasonable expectation to improve symptoms. ?  ?Reviewed reduction with anchor type scars, OP surgery, drains, post operative visits and limitations, recovery. Diminished sensation nipple and breast skin, risk of nipple loss, wound healing problems, asymmetry, incidental carcinoma, changes with wt gain/loss, aging, unacceptable cosmetic appearance reviewed. Reviewed free nipple graft as possibility- in this setting NAC would have no sensation not stimulate and color changes. Reviewed importance of weight stabilization prior to surgery and that significant weight loss following surgery will lead to early ptosis. Counseled cannot assure cup size. ?  ?Additional risks including but not limited to bleeding hematoma seroma damage to adjacent structures need for additional procedures blood clots in legs or lungs, infection reviewed. ?  ?Anticipate 945  g reduction from each breast.  ?  ?Drain teaching completed. Rx for tramadol given. ? ?

## 2022-02-01 NOTE — Progress Notes (Signed)
PCP - Pt states she does not currently have PCP- previous PCP Cathey Endow ?Cardiologist - pt denies having cardiologist ? ?PPM/ICD - denies ?Chest x-ray - N/A ?EKG - 08/31/21- this was done at [facility] when pt at hospital for [facility]. Per Fayrene Fearing, [place]- repeat not needed if can obtain tracing.  ?Stress Test - denies ?ECHO - denies ?Cardiac Cath - denies ? ?Sleep Study - denies ? ?Aspirin Instructions: pt denies taking blood thinners or ASA.  ? ?ERAS Protcol - ERAS with no drink order ? ?COVID TEST- N/A- pt denies symptoms of Covid ? ?Pt recently seen for dizziness (note in care everywhere), but states that this was d/t vertigo and she has not had any issues yet. Pt states that she was prescribed meclizine, but has not had to take this.  ? ?Anesthesia review: review requested EKG ? ?Patient denies shortness of breath, fever, cough and chest pain at PAT appointment ? ? ?All instructions explained to the patient, with a verbal understanding of the material. Patient agrees to go over the instructions while at home for a better understanding. The opportunity to ask questions was provided. ? ? ?
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
## 2022-02-02 NOTE — Anesthesia Preprocedure Evaluation (Deleted)
Anesthesia Evaluation  ? ? ?Airway ? ? ? ? ? ? ? Dental ?  ?Pulmonary ? ?  ? ? ? ? ? ? ? Cardiovascular ?hypertension,  ? ? ?  ?Neuro/Psych ?  ? GI/Hepatic ?  ?Endo/Other  ? ? Renal/GU ?  ? ?  ?Musculoskeletal ? ? Abdominal ?  ?Peds ? Hematology ?  ?Anesthesia Other Findings ? ? Reproductive/Obstetrics ? ?  ? ? ? ? ? ? ? ? ? ? ? ? ? ?  ?  ? ? ? ? ? ? ? ? ?Anesthesia Physical ?Anesthesia Plan ? ?ASA:  ? ?Anesthesia Plan:   ? ?Post-op Pain Management:   ? ?Induction:  ? ?PONV Risk Score and Plan:  ? ?Airway Management Planned:  ? ?Additional Equipment:  ? ?Intra-op Plan:  ? ?Post-operative Plan:  ? ?Informed Consent:  ? ?Plan Discussed with:  ? ?Anesthesia Plan Comments: (PAT note written 02/02/2022 by Myra Gianotti, PA-C. ?)  ? ? ? ? ? ? ?Anesthesia Quick Evaluation ? ?

## 2022-02-10 ENCOUNTER — Encounter (HOSPITAL_COMMUNITY)
Admission: RE | Disposition: A | Discharge status: Home / Self Care | Payer: Self-pay | Source: Home / Self Care | Attending: Plastic Surgery

## 2022-02-10 ENCOUNTER — Encounter (HOSPITAL_COMMUNITY): Payer: Self-pay | Admitting: Plastic Surgery

## 2022-02-10 ENCOUNTER — Ambulatory Visit (HOSPITAL_COMMUNITY)
Admission: RE | Admit: 2022-02-10 | Discharge: 2022-02-10 | Disposition: A | Discharge status: Home / Self Care | Payer: Medicaid Other | Attending: Plastic Surgery | Admitting: Plastic Surgery

## 2022-02-10 DIAGNOSIS — N62 Hypertrophy of breast: Secondary | ICD-10-CM | POA: Diagnosis not present

## 2022-02-10 DIAGNOSIS — Z538 Procedure and treatment not carried out for other reasons: Secondary | ICD-10-CM | POA: Diagnosis not present

## 2022-02-10 DIAGNOSIS — I10 Essential (primary) hypertension: Secondary | ICD-10-CM

## 2022-02-10 DIAGNOSIS — Z01818 Encounter for other preprocedural examination: Secondary | ICD-10-CM

## 2022-02-10 SURGERY — MAMMOPLASTY, REDUCTION
Anesthesia: General | Site: Breast | Laterality: Bilateral

## 2022-02-10 MED ORDER — CHLORHEXIDINE GLUCONATE CLOTH 2 % EX PADS: 6.0000 | MEDICATED_PAD | Freq: Once | CUTANEOUS | Status: DC

## 2022-02-10 MED ORDER — CELECOXIB 200 MG PO CAPS: 200.0000 mg | ORAL_CAPSULE | ORAL | Status: DC

## 2022-02-10 MED ORDER — LACTATED RINGERS IV SOLN: INTRAVENOUS | Status: DC

## 2022-02-10 MED ORDER — ACETAMINOPHEN 500 MG PO TABS
ORAL_TABLET | ORAL | Status: AC
  Administered 2022-02-10: 1000 mg via ORAL
  Filled 2022-02-10: qty 2

## 2022-02-10 MED ORDER — GABAPENTIN 300 MG PO CAPS: 300.0000 mg | ORAL_CAPSULE | ORAL | Status: DC

## 2022-02-10 MED ORDER — ORAL CARE MOUTH RINSE: 15.0000 mL | Freq: Once | OROMUCOSAL | Status: DC

## 2022-02-10 MED ORDER — CEFAZOLIN SODIUM-DEXTROSE 2-4 GM/100ML-% IV SOLN
INTRAVENOUS | Status: AC
  Filled 2022-02-10: qty 100

## 2022-02-10 MED ORDER — ACETAMINOPHEN 500 MG PO TABS: 1000.0000 mg | ORAL_TABLET | ORAL | Status: AC

## 2022-02-10 MED ORDER — CHLORHEXIDINE GLUCONATE 0.12 % MT SOLN
OROMUCOSAL | Status: AC
  Filled 2022-02-10: qty 15

## 2022-02-10 MED ORDER — CEFAZOLIN SODIUM-DEXTROSE 2-4 GM/100ML-% IV SOLN: 2.0000 g | INTRAVENOUS | Status: DC

## 2022-02-10 MED ORDER — CHLORHEXIDINE GLUCONATE 0.12 % MT SOLN: 15.0000 mL | Freq: Once | OROMUCOSAL | Status: DC

## 2022-02-10 MED ORDER — CELECOXIB 200 MG PO CAPS
ORAL_CAPSULE | ORAL | Status: AC
  Filled 2022-02-10: qty 1

## 2022-02-10 MED ORDER — GABAPENTIN 300 MG PO CAPS
ORAL_CAPSULE | ORAL | Status: AC
  Filled 2022-02-10: qty 1

## 2022-02-10 NOTE — Progress Notes (Signed)
Case cancelled per Dr. Leta Baptist, No medications given. Pt getting changed.  ?

## 2022-02-10 NOTE — Interval H&P Note (Signed)
History and Physical Interval Note: ? ?02/10/2022 ?7:01 AM ? ?Patient's son currently hospitalized in [place]. She also reports no help for child care, driving over next few days. Plan cancel surgery for today. ? ?Melinda Frost ? ? ?

## 2022-03-15 ENCOUNTER — Encounter (HOSPITAL_BASED_OUTPATIENT_CLINIC_OR_DEPARTMENT_OTHER): Payer: Self-pay | Admitting: Plastic Surgery

## 2022-03-15 ENCOUNTER — Other Ambulatory Visit: Payer: Self-pay

## 2022-03-16 ENCOUNTER — Encounter (HOSPITAL_BASED_OUTPATIENT_CLINIC_OR_DEPARTMENT_OTHER)
Admission: RE | Admit: 2022-03-16 | Discharge: 2022-03-16 | Disposition: A | Discharge status: Home / Self Care | Payer: Medicaid Other | Source: Ambulatory Visit | Attending: Plastic Surgery | Admitting: Plastic Surgery

## 2022-03-16 DIAGNOSIS — Z01818 Encounter for other preprocedural examination: Secondary | ICD-10-CM | POA: Diagnosis present

## 2022-03-16 DIAGNOSIS — I10 Essential (primary) hypertension: Secondary | ICD-10-CM | POA: Diagnosis not present

## 2022-03-16 LAB — BASIC METABOLIC PANEL
Anion gap: 8 (ref 5–15)
BUN: 12 mg/dL (ref 6–20)
CO2: 23 mmol/L (ref 22–32)
Calcium: 9 mg/dL (ref 8.9–10.3)
Chloride: 106 mmol/L (ref 98–111)
Creatinine, Ser: 0.73 mg/dL (ref 0.44–1.00)
GFR, Estimated: >60 mL/min (ref >60)
Glucose, Bld: 130 mg/dL — ABNORMAL HIGH (ref 70–99)
Potassium: 3.8 mmol/L (ref 3.5–5.1)
Sodium: 137 mmol/L (ref 135–145)

## 2022-03-16 NOTE — Progress Notes (Signed)

## 2022-03-16 NOTE — H&P (Addendum)
Subjective:     Patient ID: Melinda Frost is a 36 y.o. female.   HPI   Returns for follow up discussion prior to planned breast reduction. This was rescheduled from earlier this year due to hospitalization of her child. Had ER visit recently for nausea abdominal pain. Treated for UTI. Established with PCP- started medication for anxiety, reported this was contributing to HTN. Patient states she does not plan to start this medication.   Referred by friend that is a patient. Has had prior consultation with Dr. Thad Ranger. Current DDD. Reports several year history neck and back pain. Reports difficulty with work due to sitting nature and constant pain. Has tried chiropractor visits, who also provided posture exercises without relief. Has tried specialty fitted bras, OTC pain medication and weight loss as below without relief for over 3 month trial. Also notes frequent migraines, cannot tolerate Topamax due to side effects. Notes frequent rashes and excoriation beneath breasts. This persists despite hygiene measures, topical hydrocortisone for over 3 month trial.   Wt stable over last 6 months.   MMG 12/2019 for per patient asymmetry breasts and itching. Focal asymmetry noted left periareolar lesion. Left breast US completed 12/2019 with benign subareolar likely complicated cyst. Denies FH breast or ovarian ca.   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 Has family in area to assist with post op care.   Review of Systems  Musculoskeletal: Positive for back pain and neck pain.  Skin: Positive for rash.    Remainder 12 point review negative    Objective:   Physical Exam Cardiovascular:     Rate and Rhythm: Normal rate and regular rhythm.  Pulmonary:     Effort: Pulmonary effort is normal.     Breath sounds: Normal breath sounds.  Skin:    Comments: Fitzpatrick 5     +shoulder grooving Breasts: no masses Right> left volume Grade 3 ptosis bilateral SN to nipple R 39 L 40  cm BW R 23 L 23 cm Nipple to IMF R 15 L 17 cm      Assessment:     Macromastia Chronic neck and back pain Intertrigo Obesity    Plan:     Chronic neck and back pain, intertrigo that has failed conservative measures in setting of macromastia. No other cause of back pain or rashes noted. There is a reasonable likelihood that the symptoms are primarily due to macromastia; breast reduction surgery has reasonable expectation to improve symptoms.   Reviewed reduction with anchor type scars, drains, post operative visits and limitations, recovery. Diminished sensation nipple and breast skin, risk of nipple loss, wound healing problems, asymmetry, incidental carcinoma, changes with wt gain/loss, aging, unacceptable cosmetic appearance reviewed. Reviewed free nipple graft as possibility- in this setting NAC would have no sensation not stimulate and color changes. Counseled cannot assure cup size.   Additional risks including but not limited to bleeding hematoma seroma damage to adjacent structures need for additional procedures blood clots in legs or lungs, infection reviewed.   Anticipate 945  g reduction from each breast.    Removed ibuprofen from allergy list per patient request. Previously received tramadol Rx- she still has this. Drain teaching done again. Plan overnight stay, patient planning to drive herself to surgery and sister will drive her home.

## 2022-03-23 NOTE — Anesthesia Preprocedure Evaluation (Addendum)
Anesthesia Evaluation  Patient identified by MRN, date of birth, ID band Patient awake    Reviewed: Allergy & Precautions, NPO status , Patient's Chart, lab work & pertinent test results  History of Anesthesia Complications Negative for: history of anesthetic complications  Airway Mallampati: II  TM Distance: >3 FB Neck ROM: Full    Dental  (+) Missing, Chipped,    Pulmonary neg pulmonary ROS,    Pulmonary exam normal        Cardiovascular hypertension, Pt. on medications Normal cardiovascular exam     Neuro/Psych  Headaches, Anxiety    GI/Hepatic Neg liver ROS, GERD  Controlled,  Endo/Other  Morbid obesity  Renal/GU negative Renal ROS  negative genitourinary   Musculoskeletal negative musculoskeletal ROS (+)   Abdominal   Peds  Hematology negative hematology ROS (+)   Anesthesia Other Findings macromastia, chronic neck and back pain, intertrigo  Reproductive/Obstetrics negative OB ROS                            Anesthesia Physical Anesthesia Plan  ASA: 3  Anesthesia Plan: General   Post-op Pain Management: Tylenol PO (pre-op)*, Celebrex PO (pre-op)* and Gabapentin PO (pre-op)*   Induction: Intravenous  PONV Risk Score and Plan: 3 and Treatment may vary due to age or medical condition, Midazolam, Scopolamine patch - Pre-op, Dexamethasone and Ondansetron  Airway Management Planned: Oral ETT  Additional Equipment: None  Intra-op Plan:   Post-operative Plan: Extubation in OR  Informed Consent: I have reviewed the patients History and Physical, chart, labs and discussed the procedure including the risks, benefits and alternatives for the proposed anesthesia with the patient or authorized representative who has indicated his/her understanding and acceptance.     Dental advisory given  Plan Discussed with: CRNA  Anesthesia Plan Comments:        Anesthesia Quick  Evaluation

## 2022-03-24 ENCOUNTER — Encounter (HOSPITAL_BASED_OUTPATIENT_CLINIC_OR_DEPARTMENT_OTHER): Payer: Self-pay | Admitting: Plastic Surgery

## 2022-03-24 ENCOUNTER — Other Ambulatory Visit: Payer: Self-pay

## 2022-03-24 ENCOUNTER — Ambulatory Visit (HOSPITAL_BASED_OUTPATIENT_CLINIC_OR_DEPARTMENT_OTHER): Payer: Medicaid Other | Admitting: Anesthesiology

## 2022-03-24 ENCOUNTER — Ambulatory Visit (HOSPITAL_BASED_OUTPATIENT_CLINIC_OR_DEPARTMENT_OTHER)
Admission: RE | Admit: 2022-03-24 | Discharge: 2022-03-25 | Disposition: A | Discharge status: Home / Self Care | Payer: Medicaid Other | Attending: Plastic Surgery | Admitting: Plastic Surgery

## 2022-03-24 ENCOUNTER — Encounter (HOSPITAL_BASED_OUTPATIENT_CLINIC_OR_DEPARTMENT_OTHER)
Admission: RE | Disposition: A | Discharge status: Home / Self Care | Payer: Self-pay | Source: Home / Self Care | Attending: Plastic Surgery

## 2022-03-24 DIAGNOSIS — N62 Hypertrophy of breast: Secondary | ICD-10-CM | POA: Diagnosis present

## 2022-03-24 DIAGNOSIS — I10 Essential (primary) hypertension: Secondary | ICD-10-CM | POA: Diagnosis not present

## 2022-03-24 DIAGNOSIS — K219 Gastro-esophageal reflux disease without esophagitis: Secondary | ICD-10-CM | POA: Insufficient documentation

## 2022-03-24 DIAGNOSIS — Z6839 Body mass index (BMI) 39.0-39.9, adult: Secondary | ICD-10-CM | POA: Diagnosis not present

## 2022-03-24 DIAGNOSIS — M542 Cervicalgia: Secondary | ICD-10-CM | POA: Insufficient documentation

## 2022-03-24 DIAGNOSIS — L304 Erythema intertrigo: Secondary | ICD-10-CM | POA: Insufficient documentation

## 2022-03-24 DIAGNOSIS — Z79899 Other long term (current) drug therapy: Secondary | ICD-10-CM | POA: Insufficient documentation

## 2022-03-24 DIAGNOSIS — G8929 Other chronic pain: Secondary | ICD-10-CM | POA: Diagnosis not present

## 2022-03-24 DIAGNOSIS — M549 Dorsalgia, unspecified: Secondary | ICD-10-CM | POA: Insufficient documentation

## 2022-03-24 DIAGNOSIS — F419 Anxiety disorder, unspecified: Secondary | ICD-10-CM | POA: Insufficient documentation

## 2022-03-24 HISTORY — PX: BREAST REDUCTION SURGERY: SHX8

## 2022-03-24 HISTORY — DX: Migraine, unspecified, not intractable, without status migrainosus: G43.909

## 2022-03-24 LAB — POCT PREGNANCY, URINE: Preg Test, Ur: NEGATIVE

## 2022-03-24 SURGERY — MAMMOPLASTY, REDUCTION
Anesthesia: General | Site: Breast | Laterality: Bilateral

## 2022-03-24 MED ORDER — ROCURONIUM BROMIDE 10 MG/ML (PF) SYRINGE
PREFILLED_SYRINGE | INTRAVENOUS | Status: AC
  Filled 2022-03-24: qty 10

## 2022-03-24 MED ORDER — LIDOCAINE 2% (20 MG/ML) 5 ML SYRINGE
INTRAMUSCULAR | Status: AC
  Filled 2022-03-24: qty 5

## 2022-03-24 MED ORDER — DEXAMETHASONE SODIUM PHOSPHATE 10 MG/ML IJ SOLN
INTRAMUSCULAR | Status: AC
  Filled 2022-03-24: qty 1

## 2022-03-24 MED ORDER — 0.9 % SODIUM CHLORIDE (POUR BTL) OPTIME
TOPICAL | Status: DC | PRN
  Administered 2022-03-24: 240 mL

## 2022-03-24 MED ORDER — LIDOCAINE HCL (CARDIAC) PF 100 MG/5ML IV SOSY
PREFILLED_SYRINGE | INTRAVENOUS | Status: DC | PRN
  Administered 2022-03-24: 100 mg via INTRAVENOUS

## 2022-03-24 MED ORDER — CEFAZOLIN SODIUM-DEXTROSE 2-4 GM/100ML-% IV SOLN
INTRAVENOUS | Status: AC
  Filled 2022-03-24: qty 100

## 2022-03-24 MED ORDER — CEFAZOLIN SODIUM-DEXTROSE 2-4 GM/100ML-% IV SOLN: 2.0000 g | INTRAVENOUS | Status: DC

## 2022-03-24 MED ORDER — LACTATED RINGERS IV SOLN: INTRAVENOUS | Status: DC

## 2022-03-24 MED ORDER — MIDAZOLAM HCL 2 MG/2ML IJ SOLN
INTRAMUSCULAR | Status: AC
  Filled 2022-03-24: qty 2

## 2022-03-24 MED ORDER — CHLORHEXIDINE GLUCONATE CLOTH 2 % EX PADS: 6.0000 | MEDICATED_PAD | Freq: Once | CUTANEOUS | Status: DC

## 2022-03-24 MED ORDER — HYDROMORPHONE HCL 1 MG/ML IJ SOLN
INTRAMUSCULAR | Status: AC
  Filled 2022-03-24: qty 1

## 2022-03-24 MED ORDER — ONDANSETRON HCL 4 MG/2ML IJ SOLN: 4.0000 mg | Freq: Four times a day (QID) | INTRAMUSCULAR | Status: DC | PRN

## 2022-03-24 MED ORDER — GABAPENTIN 300 MG PO CAPS
ORAL_CAPSULE | ORAL | Status: AC
  Filled 2022-03-24: qty 1

## 2022-03-24 MED ORDER — HYDROMORPHONE HCL 1 MG/ML IJ SOLN: 0.5000 mg | INTRAMUSCULAR | Status: DC | PRN

## 2022-03-24 MED ORDER — SCOPOLAMINE 1 MG/3DAYS TD PT72
MEDICATED_PATCH | TRANSDERMAL | Status: AC
  Filled 2022-03-24: qty 1

## 2022-03-24 MED ORDER — PROPOFOL 10 MG/ML IV BOLUS
INTRAVENOUS | Status: AC
  Filled 2022-03-24: qty 20

## 2022-03-24 MED ORDER — GABAPENTIN 300 MG PO CAPS
300.0000 mg | ORAL_CAPSULE | ORAL | Status: AC
  Administered 2022-03-24: 300 mg via ORAL

## 2022-03-24 MED ORDER — FENTANYL CITRATE (PF) 100 MCG/2ML IJ SOLN
INTRAMUSCULAR | Status: AC
  Filled 2022-03-24: qty 2

## 2022-03-24 MED ORDER — FENTANYL CITRATE (PF) 100 MCG/2ML IJ SOLN
INTRAMUSCULAR | Status: DC | PRN
  Administered 2022-03-24: 100 ug via INTRAVENOUS
  Administered 2022-03-24: 50 ug via INTRAVENOUS

## 2022-03-24 MED ORDER — DEXAMETHASONE SODIUM PHOSPHATE 4 MG/ML IJ SOLN
INTRAMUSCULAR | Status: DC | PRN
  Administered 2022-03-24: 5 mg via INTRAVENOUS

## 2022-03-24 MED ORDER — CELECOXIB 200 MG PO CAPS
200.0000 mg | ORAL_CAPSULE | ORAL | Status: AC
  Administered 2022-03-24: 200 mg via ORAL

## 2022-03-24 MED ORDER — HYDROMORPHONE HCL 1 MG/ML IJ SOLN
INTRAMUSCULAR | Status: DC | PRN
  Administered 2022-03-24 (×4): .25 mg via INTRAVENOUS

## 2022-03-24 MED ORDER — MIDAZOLAM HCL 5 MG/5ML IJ SOLN
INTRAMUSCULAR | Status: DC | PRN
  Administered 2022-03-24: 2 mg via INTRAVENOUS

## 2022-03-24 MED ORDER — KCL IN DEXTROSE-NACL 20-5-0.45 MEQ/L-%-% IV SOLN
INTRAVENOUS | Status: DC
  Filled 2022-03-24: qty 1000

## 2022-03-24 MED ORDER — HYDROCHLOROTHIAZIDE 25 MG PO TABS
25.0000 mg | ORAL_TABLET | Freq: Every day | ORAL | Status: DC
  Administered 2022-03-24: 25 mg via ORAL
  Filled 2022-03-24: qty 1

## 2022-03-24 MED ORDER — ACETAMINOPHEN 500 MG PO TABS
1000.0000 mg | ORAL_TABLET | Freq: Once | ORAL | Status: AC
  Administered 2022-03-24: 1000 mg via ORAL

## 2022-03-24 MED ORDER — ONDANSETRON HCL 4 MG/2ML IJ SOLN
INTRAMUSCULAR | Status: AC
  Filled 2022-03-24: qty 2

## 2022-03-24 MED ORDER — HYDROMORPHONE HCL 1 MG/ML IJ SOLN
INTRAMUSCULAR | Status: AC
  Filled 2022-03-24: qty 0.5

## 2022-03-24 MED ORDER — ONDANSETRON 4 MG PO TBDP: 4.0000 mg | ORAL_TABLET | Freq: Four times a day (QID) | ORAL | Status: DC | PRN

## 2022-03-24 MED ORDER — BUPIVACAINE HCL (PF) 0.5 % IJ SOLN
INTRAMUSCULAR | Status: DC | PRN
  Administered 2022-03-24: 30 mL

## 2022-03-24 MED ORDER — ACETAMINOPHEN 500 MG PO TABS
1000.0000 mg | ORAL_TABLET | Freq: Once | ORAL | Status: AC
Start: 2022-03-24 — End: 2022-03-24
  Administered 2022-03-24: 1000 mg via ORAL

## 2022-03-24 MED ORDER — ACETAMINOPHEN 500 MG PO TABS
1000.0000 mg | ORAL_TABLET | ORAL | Status: DC
  Filled 2022-03-24: qty 2

## 2022-03-24 MED ORDER — HYDROMORPHONE HCL 1 MG/ML IJ SOLN
0.2500 mg | INTRAMUSCULAR | Status: DC | PRN
  Administered 2022-03-24: 0.5 mg via INTRAVENOUS

## 2022-03-24 MED ORDER — ROCURONIUM BROMIDE 100 MG/10ML IV SOLN
INTRAVENOUS | Status: DC | PRN
Start: 2022-03-24 — End: 2022-03-24
  Administered 2022-03-24: 60 mg via INTRAVENOUS

## 2022-03-24 MED ORDER — PROPOFOL 10 MG/ML IV BOLUS
INTRAVENOUS | Status: DC | PRN
Start: 2022-03-24 — End: 2022-03-24
  Administered 2022-03-24: 200 mg via INTRAVENOUS

## 2022-03-24 MED ORDER — ACETAMINOPHEN 500 MG PO TABS
ORAL_TABLET | ORAL | Status: AC
  Filled 2022-03-24: qty 2

## 2022-03-24 MED ORDER — BUPIVACAINE HCL (PF) 0.5 % IJ SOLN
INTRAMUSCULAR | Status: AC
  Filled 2022-03-24: qty 30

## 2022-03-24 MED ORDER — TRAMADOL HCL 50 MG PO TABS
50.0000 mg | ORAL_TABLET | Freq: Four times a day (QID) | ORAL | Status: DC | PRN
  Administered 2022-03-24: 50 mg via ORAL
  Filled 2022-03-24: qty 1

## 2022-03-24 MED ORDER — SCOPOLAMINE 1 MG/3DAYS TD PT72
1.0000 | MEDICATED_PATCH | Freq: Once | TRANSDERMAL | Status: DC
  Administered 2022-03-24: 1.5 mg via TRANSDERMAL

## 2022-03-24 MED ORDER — CELECOXIB 200 MG PO CAPS
ORAL_CAPSULE | ORAL | Status: AC
  Filled 2022-03-24: qty 1

## 2022-03-24 MED ORDER — ONDANSETRON HCL 4 MG/2ML IJ SOLN
INTRAMUSCULAR | Status: DC | PRN
  Administered 2022-03-24: 4 mg via INTRAVENOUS

## 2022-03-24 MED ORDER — KETOROLAC TROMETHAMINE 30 MG/ML IJ SOLN
30.0000 mg | Freq: Three times a day (TID) | INTRAMUSCULAR | Status: AC
  Administered 2022-03-24 – 2022-03-25 (×3): 30 mg via INTRAVENOUS
  Filled 2022-03-24 (×3): qty 1

## 2022-03-24 MED ORDER — DROPERIDOL 2.5 MG/ML IJ SOLN: 0.6250 mg | Freq: Once | INTRAMUSCULAR | Status: DC | PRN

## 2022-03-24 SURGICAL SUPPLY — 45 items
ADH SKN CLS APL DERMABOND .7 (GAUZE/BANDAGES/DRESSINGS) ×2
APL PRP STRL LF DISP 70% ISPRP (MISCELLANEOUS) ×2
BINDER BREAST XXLRG (GAUZE/BANDAGES/DRESSINGS) ×1 IMPLANT
BLADE SURG 10 STRL SS (BLADE) ×7 IMPLANT
BNDG GAUZE ELAST 4 BULKY (GAUZE/BANDAGES/DRESSINGS) ×4 IMPLANT
CANISTER SUCT 1200ML W/VALVE (MISCELLANEOUS) ×2 IMPLANT
CHLORAPREP W/TINT 26 (MISCELLANEOUS) ×4 IMPLANT
COVER BACK TABLE 60X90IN (DRAPES) ×2 IMPLANT
COVER MAYO STAND STRL (DRAPES) ×2 IMPLANT
DERMABOND ADVANCED (GAUZE/BANDAGES/DRESSINGS) ×2
DERMABOND ADVANCED .7 DNX12 (GAUZE/BANDAGES/DRESSINGS) ×2 IMPLANT
DRAIN CHANNEL 15F RND FF W/TCR (WOUND CARE) ×2 IMPLANT
DRAPE TOP ARMCOVERS (MISCELLANEOUS) ×2 IMPLANT
DRAPE U-SHAPE 76X120 STRL (DRAPES) ×2 IMPLANT
DRAPE UTILITY XL STRL (DRAPES) ×2 IMPLANT
DRSG PAD ABDOMINAL 8X10 ST (GAUZE/BANDAGES/DRESSINGS) ×4 IMPLANT
ELECT COATED BLADE 2.86 ST (ELECTRODE) ×2 IMPLANT
ELECT REM PT RETURN 9FT ADLT (ELECTROSURGICAL) ×2
ELECTRODE REM PT RTRN 9FT ADLT (ELECTROSURGICAL) ×1 IMPLANT
EVACUATOR SILICONE 100CC (DRAIN) ×2 IMPLANT
GLOVE BIO SURGEON STRL SZ 6 (GLOVE) ×4 IMPLANT
GOWN STRL REUS W/ TWL LRG LVL3 (GOWN DISPOSABLE) ×2 IMPLANT
GOWN STRL REUS W/TWL LRG LVL3 (GOWN DISPOSABLE) ×4
NDL HYPO 25X1 1.5 SAFETY (NEEDLE) IMPLANT
NEEDLE HYPO 25X1 1.5 SAFETY (NEEDLE) ×2 IMPLANT
NS IRRIG 1000ML POUR BTL (IV SOLUTION) ×2 IMPLANT
PACK BASIN DAY SURGERY FS (CUSTOM PROCEDURE TRAY) ×2 IMPLANT
PENCIL SMOKE EVACUATOR (MISCELLANEOUS) ×2 IMPLANT
PIN SAFETY STERILE (MISCELLANEOUS) ×2 IMPLANT
SHEET MEDIUM DRAPE 40X70 STRL (DRAPES) ×4 IMPLANT
SLEEVE SCD COMPRESS KNEE MED (STOCKING) ×2 IMPLANT
SPONGE T-LAP 18X18 ~~LOC~~+RFID (SPONGE) ×6 IMPLANT
STAPLER VISISTAT 35W (STAPLE) ×2 IMPLANT
SUT ETHILON 2 0 FS 18 (SUTURE) ×2 IMPLANT
SUT MNCRL AB 4-0 PS2 18 (SUTURE) ×4 IMPLANT
SUT VIC AB 3-0 PS1 18 (SUTURE) ×14
SUT VIC AB 3-0 PS1 18XBRD (SUTURE) ×6 IMPLANT
SUT VICRYL 4-0 PS2 18IN ABS (SUTURE) ×4 IMPLANT
SUT VLOC 180 P-14 24 (SUTURE) ×4 IMPLANT
SYR BULB IRRIG 60ML STRL (SYRINGE) ×2 IMPLANT
SYR CONTROL 10ML LL (SYRINGE) ×1 IMPLANT
TOWEL GREEN STERILE FF (TOWEL DISPOSABLE) ×4 IMPLANT
TUBE CONNECTING 20X1/4 (TUBING) ×2 IMPLANT
UNDERPAD 30X36 HEAVY ABSORB (UNDERPADS AND DIAPERS) ×4 IMPLANT
YANKAUER SUCT BULB TIP NO VENT (SUCTIONS) ×2 IMPLANT

## 2022-03-24 NOTE — Transfer of Care (Signed)
Immediate Anesthesia Transfer of Care Note  Patient: Melinda Frost  Procedure(s) Performed: MAMMARY REDUCTION  (BREAST) (Bilateral: Breast)  Patient Location: PACU  Anesthesia Type:General  Level of Consciousness: awake, alert  and oriented  Airway & Oxygen Therapy: Patient Spontanous Breathing and Patient connected to face mask oxygen  Post-op Assessment: Report given to RN and Post -op Vital signs reviewed and stable  Post vital signs: Reviewed and stable  Last Vitals:  Vitals Value Taken Time  BP 149/87 03/24/22 1056  Temp    Pulse 101 03/24/22 1059  Resp 30 03/24/22 1059  SpO2 99 % 03/24/22 1059  Vitals shown include unvalidated device data.  Last Pain:  Vitals:   03/24/22 0648  TempSrc: Oral  PainSc: 0-No pain      Patients Stated Pain Goal: 8 (03/24/22 8453)  Complications: No notable events documented.

## 2022-03-24 NOTE — Interval H&P Note (Signed)
History and Physical Interval Note:  03/24/2022 6:51 AM  Melinda Frost  has presented today for surgery, with the diagnosis of macromastia, chronic neck and back pain, intertrigo.  The various methods of treatment have been discussed with the patient and family. After consideration of risks, benefits and other options for treatment, the patient has consented to  Procedure(s): MAMMARY REDUCTION  (BREAST) (Bilateral) as a surgical intervention.  The patient's history has been reviewed, patient examined, no change in status, stable for surgery.  I have reviewed the patient's chart and labs.  Questions were answered to the patient's satisfaction.     Arnoldo Hooker Rubina Basinski

## 2022-03-24 NOTE — Discharge Instructions (Signed)

## 2022-03-24 NOTE — Op Note (Signed)
Operative Note   DATE OF OPERATION: 5.19.23  LOCATION: [facility]-observation  SURGICAL DIVISION: Plastic Surgery  PREOPERATIVE DIAGNOSES:  1. Macromastia 2. Chronic neck and back pain 3. Intertrigo 4. Shoulder pain  POSTOPERATIVE DIAGNOSES:  same  PROCEDURE:  Bilateral breast reduction  SURGEON: Glenna Fellows MD MBA  ASSISTANT: none  ANESTHESIA:  General.   EBL: 60 ml  COMPLICATIONS: None immediate.   INDICATIONS FOR PROCEDURE:  The patient, Melinda Frost, is a 36 y.o. female born on [birth year], is here for treatment chronic neck and back pain shoulder pain intertrigo in setting of macromastia that has failed conservative measures.    FINDINGS: Right reduction 827 g Left reduction 693 g  DESCRIPTION OF PROCEDURE:  The patient was marked standing in the preoperative area to mark sternal notch, chest midline, anterior axillary lines, inframammary folds. The location of new nipple areolar complex was marked at level of on inframammary fold on anterior surface breast by palpation. This was marked symmetric over bilateral breasts. With aid of Wise pattern marker, location of new nipple areolar complex and vertical limbs (7 cm) were marked by displacement of breasts along meridian. The patient was taken to the operating room. SCDs were placed and IV antibiotics were given. The patient's operative site was prepped and draped in a sterile fashion. A time out was performed and all information was confirmed to be correct.     Over left breast, superior medial pedicle marked and nipple areolar complex incised with 42 mm diameter marker. Pedicle deepithlialized and developed to chest wall. Breast tissue resected over lower pole. Medial and lateral flaps developed. Additional lateral and superior breast tissue excised. Breast tailor tacked closed.    I then directed attention to right breast where superior medial pedicle designed. NAC marked with 42 mm diameter marker. The pedicle  was deepithelialized. Pedicle developed to chest wall. Breast tissue resected over lower pole. Medial and lateral flaps developed. Additional lateral and superior pole breast tissue excised. Breast tailor tacked closed, and patient brought to upright sitting position and assessed for symmetry. Patient returned to supine position. Breast cavities irrigated and hemostasis obtained. Local anesthetic infiltrated throughout each breast. 15 Fr JP placed in each breast and secured with 2-0 nylon. Closure completed bilateral with 3-0 vicryl to approximate dermis along inframammary fold and vertical limb. NAC inset with 4-0 vicryl in dermis. Skin closure completed with 4-0 monocryl subcuticular throughout vertical limbs and NAC. 3-0 V lock subcuticular skin closure completed along inframammary folds. Tissue adhesive applied. Dry dressing and breast binder applied.  The patient was allowed to wake from anesthesia, extubated and taken to the recovery room in satisfactory condition.   SPECIMENS: right and left breast reduction  DRAINS: 15 Fr JP in right and left breast  Glenna Fellows, MD [facility] Plastic & Reconstructive Surgery  Office/ physician access line after hours [phone]

## 2022-03-24 NOTE — Anesthesia Postprocedure Evaluation (Signed)
Anesthesia Post Note  Patient: Melinda Frost  Procedure(s) Performed: MAMMARY REDUCTION  (BREAST) (Bilateral: Breast)     Patient location during evaluation: PACU Anesthesia Type: General Level of consciousness: awake and alert Pain management: pain level controlled Vital Signs Assessment: post-procedure vital signs reviewed and stable Respiratory status: spontaneous breathing, nonlabored ventilation and respiratory function stable Cardiovascular status: blood pressure returned to baseline and tachycardic Postop Assessment: no apparent nausea or vomiting Anesthetic complications: no Comments: Sinus tachycardia consistent with preop vitals. EKG obtained in PACU due to increased rates into 130s showed sinus rhythm with prolonged QTc. Will avoid administration of additional QT prolonging medications.    No notable events documented.  Last Vitals:  Vitals:   03/24/22 1200 03/24/22 1215  BP: 125/69 130/72  Pulse: (!) 119   Resp: (!) 23 (!) 23  Temp:    SpO2: 100%     Last Pain:  Vitals:   03/24/22 1215  TempSrc:   PainSc: Asleep                 Shanda Howells

## 2022-03-24 NOTE — Anesthesia Procedure Notes (Signed)
Procedure Name: Intubation Date/Time: 03/24/2022 7:46 AM Performed by: Karen Kitchens, CRNA Pre-anesthesia Checklist: Patient identified, Emergency Drugs available, Suction available and Patient being monitored Patient Re-evaluated:Patient Re-evaluated prior to induction Oxygen Delivery Method: Circle system utilized Preoxygenation: Pre-oxygenation with 100% oxygen Induction Type: IV induction Ventilation: Mask ventilation without difficulty Tube type: Oral Tube size: 7.0 mm Number of attempts: 1 Airway Equipment and Method: Stylet and Oral airway Placement Confirmation: ETT inserted through vocal cords under direct vision, positive ETCO2, breath sounds checked- equal and bilateral and CO2 detector Secured at: 22 cm Tube secured with: Tape Dental Injury: Teeth and Oropharynx as per pre-operative assessment

## 2022-03-25 DIAGNOSIS — N62 Hypertrophy of breast: Secondary | ICD-10-CM | POA: Diagnosis not present

## 2022-03-25 MED ORDER — ACETAMINOPHEN 500 MG PO TABS: 1000.0000 mg | ORAL_TABLET | Freq: Once | ORAL | Status: DC

## 2022-03-25 NOTE — Discharge Summary (Signed)
  Physician Discharge Summary   Patient: Melinda Frost MRN: [id number] DOB: 30-Dec-1985  Admit date:     03/24/2022  Discharge date: 03/25/22  Discharge Physician: Glenna Fellows     Admission Diagnoses: Macromastia    Discharge Diagnoses:  same    Discharged Condition: stable   Hospital Course: Post operatively patient did well with pain controlled, tolerating diet, and ambulatory with minimal assist. Instructed on drain care and bathing.   Treatments: surgery: bilateral breast reduction 5.19.23     Disposition: Discharge disposition: 01-Home or Self Care   Allergies as of 03/25/2022   No Known Allergies      Medication List     STOP taking these medications    ondansetron 4 MG disintegrating tablet Commonly known as: Zofran ODT       TAKE these medications    hydrochlorothiazide 25 MG tablet Commonly known as: HYDRODIURIL Take 1 tablet (25 mg total) by mouth daily.           Discharge Instructions     Call MD for:  redness, tenderness, or signs of infection (pain, swelling, bleeding, redness, odor or green/yellow discharge around incision site)   Complete by: As directed    Call MD for:  temperature >100.5   Complete by: As directed    Discharge instructions   Complete by: As directed    Ok to remove dressings and shower am 5.21.23. Soap and water ok, pat incisions dry. No creams or ointments over incisions. Do not let drains dangle in shower, attach to lanyard or similar.Strip and record drains twice daily and bring log to clinic visit.  Breast binder or soft compression bra all other times.  Ok to raise arms above shoulders for bathing and dressing.  No house yard work or exercise until cleared by MD.   Recommend ibuprofen with meals to aid with pain control. Patient received pain Rx preop. Also ok to use Tylenol for pain. Recommend Miralax or Dulcolax as needed for constipation.   Driving Restrictions   Complete by: As directed    No driving if  taking prescription pain medication   Lifting restrictions   Complete by: As directed    No lifting > 5-10 lbs until cleared by MD   Resume previous diet   Complete by: As directed        Follow-up Information       Glenna Fellows, MD Follow up in 1 week(s).   Specialty: Plastic Surgery Why: as scheduled Contact information: [address] [place] [address] [phone]

## 2022-03-26 ENCOUNTER — Encounter (HOSPITAL_BASED_OUTPATIENT_CLINIC_OR_DEPARTMENT_OTHER): Payer: Self-pay | Admitting: Plastic Surgery

## 2022-03-27 LAB — SURGICAL PATHOLOGY

## 2022-12-13 ENCOUNTER — Encounter (HOSPITAL_BASED_OUTPATIENT_CLINIC_OR_DEPARTMENT_OTHER): Payer: Self-pay | Admitting: Emergency Medicine

## 2022-12-13 ENCOUNTER — Emergency Department (HOSPITAL_BASED_OUTPATIENT_CLINIC_OR_DEPARTMENT_OTHER): Payer: Managed Care, Other (non HMO)

## 2022-12-13 ENCOUNTER — Encounter (HOSPITAL_COMMUNITY): Payer: Managed Care, Other (non HMO)

## 2022-12-13 ENCOUNTER — Emergency Department (HOSPITAL_BASED_OUTPATIENT_CLINIC_OR_DEPARTMENT_OTHER): Payer: Managed Care, Other (non HMO) | Admitting: Radiology

## 2022-12-13 ENCOUNTER — Emergency Department (HOSPITAL_BASED_OUTPATIENT_CLINIC_OR_DEPARTMENT_OTHER)
Admission: EM | Admit: 2022-12-13 | Discharge: 2022-12-13 | Disposition: A | Discharge status: Against Medical Advice | Payer: Managed Care, Other (non HMO) | Attending: Emergency Medicine | Admitting: Emergency Medicine

## 2022-12-13 ENCOUNTER — Other Ambulatory Visit: Payer: Self-pay

## 2022-12-13 DIAGNOSIS — M7989 Other specified soft tissue disorders: Secondary | ICD-10-CM | POA: Diagnosis not present

## 2022-12-13 DIAGNOSIS — M25512 Pain in left shoulder: Secondary | ICD-10-CM | POA: Diagnosis not present

## 2022-12-13 DIAGNOSIS — R202 Paresthesia of skin: Secondary | ICD-10-CM | POA: Diagnosis not present

## 2022-12-13 DIAGNOSIS — R079 Chest pain, unspecified: Secondary | ICD-10-CM | POA: Diagnosis not present

## 2022-12-13 DIAGNOSIS — E876 Hypokalemia: Secondary | ICD-10-CM

## 2022-12-13 LAB — D-DIMER, QUANTITATIVE: D-Dimer, Quant: 0.73 ug/mL-FEU — ABNORMAL HIGH (ref 0.00–0.50)

## 2022-12-13 LAB — CBC WITH DIFFERENTIAL/PLATELET
Abs Immature Granulocytes: 0.02 10*3/uL (ref 0.00–0.07)
Basophils Absolute: 0 10*3/uL (ref 0.0–0.1)
Basophils Relative: 0 %
Eosinophils Absolute: 0.1 10*3/uL (ref 0.0–0.5)
Eosinophils Relative: 1 %
HCT: 35.9 % — ABNORMAL LOW (ref 36.0–46.0)
Hemoglobin: 11.6 g/dL — ABNORMAL LOW (ref 12.0–15.0)
Immature Granulocytes: 0 %
Lymphocytes Relative: 25 %
Lymphs Abs: 2.2 10*3/uL (ref 0.7–4.0)
MCH: 27.5 pg (ref 26.0–34.0)
MCHC: 32.3 g/dL (ref 30.0–36.0)
MCV: 85.1 fL (ref 80.0–100.0)
Monocytes Absolute: 0.5 10*3/uL (ref 0.1–1.0)
Monocytes Relative: 6 %
Neutro Abs: 5.9 10*3/uL (ref 1.7–7.7)
Neutrophils Relative %: 68 %
Platelets: 275 10*3/uL (ref 150–400)
RBC: 4.22 MIL/uL (ref 3.87–5.11)
RDW: 14.2 % (ref 11.5–15.5)
WBC: 8.7 10*3/uL (ref 4.0–10.5)
nRBC: 0 % (ref 0.0–0.2)

## 2022-12-13 LAB — BASIC METABOLIC PANEL
Anion gap: 9 (ref 5–15)
BUN: 13 mg/dL (ref 6–20)
CO2: 28 mmol/L (ref 22–32)
Calcium: 9.3 mg/dL (ref 8.9–10.3)
Chloride: 103 mmol/L (ref 98–111)
Creatinine, Ser: 0.69 mg/dL (ref 0.44–1.00)
GFR, Estimated: >60 mL/min (ref >60)
Glucose, Bld: 87 mg/dL (ref 70–99)
Potassium: 3.3 mmol/L — ABNORMAL LOW (ref 3.5–5.1)
Sodium: 140 mmol/L (ref 135–145)

## 2022-12-13 LAB — TROPONIN I (HIGH SENSITIVITY): Troponin I (High Sensitivity): 2 ng/L (ref <18)

## 2022-12-13 MED ORDER — IOHEXOL 350 MG/ML SOLN: 100.0000 mL | Freq: Once | INTRAVENOUS | Status: DC | PRN

## 2022-12-13 MED ORDER — POTASSIUM CHLORIDE CRYS ER 20 MEQ PO TBCR
20.0000 meq | EXTENDED_RELEASE_TABLET | Freq: Once | ORAL | Status: AC
  Administered 2022-12-13: 20 meq via ORAL
  Filled 2022-12-13: qty 1

## 2022-12-13 NOTE — Discharge Instructions (Signed)
You are seen today for chest pain and leg pain.  Your potassium was slightly low.  We wanted to do a CT scan of your chest to rule out a blood clot due to your elevated D-dimer and family history of blood clots she did not let us get today.  Certainly if you change your mind or your symptoms worsen come back to the ER right away.  Otherwise follow-up closely with your primary care doctor.  The ultrasound of your legs was normal

## 2022-12-13 NOTE — ED Notes (Signed)
Pt refused CT and repeat Trop. Left AMA. IV removed x1. Leanne Chang, RN

## 2022-12-13 NOTE — ED Triage Notes (Addendum)
Chest pain, central chest that started 40 min ago, has subsided now. Also reports aches to her legs for a few weeks.

## 2022-12-13 NOTE — ED Provider Notes (Signed)
[facility] Provider Note   CSN: [id number] Arrival date & time: 12/13/22  0941     History  Chief Complaint  Patient presents with   Chest Pain    Melinda Frost is a 37 y.o. female.  Reports past medical history of anxiety and depression.  Presents emergency department today complaining of pain in the left chest that lasted only couple minutes while at work that felt like tightness.  The pain did not radiate.  She is she does have some pain in the left shoulder and tingling in the left hand that she reports has been chronic and she is seeing neurology for this, reports this has been unchanged. She is the pain is completely gone she is feeling better now no shortness of breath.  No history of VTE, she is on hormones but she has been having pain in her calves for the past couple of weeks.  She reports she thinks this may have been related to anxiety, she reports she moved to [place] from [place], and her child's father was murdered.   Chest Pain      Home Medications Prior to Admission medications   Medication Sig Start Date End Date Taking? Authorizing Provider  hydrochlorothiazide (HYDRODIURIL) 25 MG tablet Take 1 tablet (25 mg total) by mouth daily. 05/26/19   Dorie Rank, MD      Allergies    Patient has no known allergies.    Review of Systems   Review of Systems  Cardiovascular:  Positive for chest pain.    Physical Exam Updated Vital Signs BP (!) 137/96 (BP Location: Right Arm)   Pulse 92   Temp 98.3 F (36.8 C)   Resp 16   Ht 5\' 5"  (1.651 m)   Wt 104.3 kg   SpO2 100%   BMI 38.27 kg/m  Physical Exam Vitals and nursing note reviewed.  Constitutional:      General: She is not in acute distress.    Appearance: She is well-developed.  HENT:     Head: Normocephalic and atraumatic.  Eyes:     Extraocular Movements: Extraocular movements intact.     Conjunctiva/sclera: Conjunctivae normal.     Pupils: Pupils  are equal, round, and reactive to light.  Cardiovascular:     Rate and Rhythm: Normal rate and regular rhythm.     Heart sounds: No murmur heard. Pulmonary:     Effort: Pulmonary effort is normal. No respiratory distress.     Breath sounds: Normal breath sounds.  Chest:     Chest wall: No tenderness or crepitus.  Abdominal:     Palpations: Abdomen is soft.     Tenderness: There is no abdominal tenderness.  Musculoskeletal:        General: No swelling. Normal range of motion.     Cervical back: Normal range of motion and neck supple.     Right lower leg: Tenderness present. No edema.     Left lower leg: Tenderness present. No edema.  Skin:    General: Skin is warm and dry.     Capillary Refill: Capillary refill takes less than 2 seconds.  Neurological:     General: No focal deficit present.     Mental Status: She is alert and oriented to person, place, and time.  Psychiatric:        Mood and Affect: Mood normal.        Behavior: Behavior normal.     ED Results / Procedures /  Treatments   Labs (all labs ordered are listed, but only abnormal results are displayed) Labs Reviewed  CBC WITH DIFFERENTIAL/PLATELET  BASIC METABOLIC PANEL  TROPONIN I (HIGH SENSITIVITY)    EKG None  Radiology No results found.  Procedures Procedures    Medications Ordered in ED Medications - No data to display  ED Course/ Medical Decision Making/ A&P                             Medical Decision Making The patient presented today for chest pain that had started just PTA. EKG showed NSR with T wave inversions in II and AVF, similar to previous, Chest Xray was independently reviewed by me and shows no pulmonary edema or infiltrate, no pneumothorax. I agree with radiology interpretation.   Symptoms resovled PTA  I considered a broad differential including but not limited to ACS, PE, Dissection, pneumothorax, costochondritis, pneumonia, GERD, pericarditis, and pericardial effusion.  PE  is considered low risk and had positive D-dimer, she initially agreed to a CTA of her chest but then refused stating she had 1 in August and it was normal and her D-dimer is a same now as it was then.  She did agree to bilateral Dopplers which were negative.  We highly recommended a CTA of the chest and patient adamantly refused and ultimately left AGAINST MEDICAL ADVICE. I feel disssection is extremely unlikely  Symptoms and EKG are not consisted with pericarditis  Their heart score is Low Risk. Troponins are normal  Plan is for follow-up with outpatient, felt her symptoms were due to anxiety is going to speak with her primary care doctor  Amount and/or Complexity of Data Reviewed Labs: ordered. Radiology: ordered.  Risk Prescription drug management.           Final Clinical Impression(s) / ED Diagnoses Final diagnoses:  None    Rx / DC Orders ED Discharge Orders     None         Darci Current 12/13/22 1944    Ezequiel Essex, MD 12/14/22 [phone]

## 2023-04-20 ENCOUNTER — Emergency Department (HOSPITAL_BASED_OUTPATIENT_CLINIC_OR_DEPARTMENT_OTHER): Payer: Managed Care, Other (non HMO)

## 2023-04-20 ENCOUNTER — Other Ambulatory Visit: Payer: Self-pay

## 2023-04-20 ENCOUNTER — Emergency Department (HOSPITAL_BASED_OUTPATIENT_CLINIC_OR_DEPARTMENT_OTHER)
Admission: EM | Admit: 2023-04-20 | Discharge: 2023-04-20 | Disposition: A | Discharge status: Home / Self Care | Payer: Managed Care, Other (non HMO) | Attending: Emergency Medicine | Admitting: Emergency Medicine

## 2023-04-20 ENCOUNTER — Encounter (HOSPITAL_BASED_OUTPATIENT_CLINIC_OR_DEPARTMENT_OTHER): Payer: Self-pay | Admitting: Emergency Medicine

## 2023-04-20 DIAGNOSIS — I10 Essential (primary) hypertension: Secondary | ICD-10-CM | POA: Diagnosis not present

## 2023-04-20 DIAGNOSIS — E876 Hypokalemia: Secondary | ICD-10-CM | POA: Insufficient documentation

## 2023-04-20 DIAGNOSIS — Z20822 Contact with and (suspected) exposure to covid-19: Secondary | ICD-10-CM | POA: Diagnosis not present

## 2023-04-20 DIAGNOSIS — R1011 Right upper quadrant pain: Secondary | ICD-10-CM

## 2023-04-20 DIAGNOSIS — N3000 Acute cystitis without hematuria: Secondary | ICD-10-CM | POA: Diagnosis not present

## 2023-04-20 DIAGNOSIS — M79605 Pain in left leg: Secondary | ICD-10-CM | POA: Diagnosis not present

## 2023-04-20 LAB — COMPREHENSIVE METABOLIC PANEL
ALT: 12 U/L (ref 0–44)
AST: 18 U/L (ref 15–41)
Albumin: 4 g/dL (ref 3.5–5.0)
Alkaline Phosphatase: 90 U/L (ref 38–126)
Anion gap: 9 (ref 5–15)
BUN: 8 mg/dL (ref 6–20)
CO2: 26 mmol/L (ref 22–32)
Calcium: 9.1 mg/dL (ref 8.9–10.3)
Chloride: 104 mmol/L (ref 98–111)
Creatinine, Ser: 0.66 mg/dL (ref 0.44–1.00)
GFR, Estimated: >60 mL/min (ref >60)
Glucose, Bld: 83 mg/dL (ref 70–99)
Potassium: 3.2 mmol/L — ABNORMAL LOW (ref 3.5–5.1)
Sodium: 139 mmol/L (ref 135–145)
Total Bilirubin: 0.5 mg/dL (ref 0.3–1.2)
Total Protein: 8.1 g/dL (ref 6.5–8.1)

## 2023-04-20 LAB — GROUP A STREP BY PCR: Group A Strep by PCR: NOT DETECTED

## 2023-04-20 LAB — CBC
HCT: 35.7 % — ABNORMAL LOW (ref 36.0–46.0)
Hemoglobin: 11.8 g/dL — ABNORMAL LOW (ref 12.0–15.0)
MCH: 28.1 pg (ref 26.0–34.0)
MCHC: 33.1 g/dL (ref 30.0–36.0)
MCV: 85 fL (ref 80.0–100.0)
Platelets: 257 10*3/uL (ref 150–400)
RBC: 4.2 MIL/uL (ref 3.87–5.11)
RDW: 14.6 % (ref 11.5–15.5)
WBC: 7.9 10*3/uL (ref 4.0–10.5)
nRBC: 0 % (ref 0.0–0.2)

## 2023-04-20 LAB — URINALYSIS, ROUTINE W REFLEX MICROSCOPIC
Bilirubin Urine: NEGATIVE
Glucose, UA: NEGATIVE mg/dL
Ketones, ur: NEGATIVE mg/dL
Nitrite: NEGATIVE
Specific Gravity, Urine: 1.019 (ref 1.005–1.030)
WBC, UA: >50 WBC/hpf (ref 0–5)
pH: 6 (ref 5.0–8.0)

## 2023-04-20 LAB — LIPASE, BLOOD: Lipase: 30 U/L (ref 11–51)

## 2023-04-20 LAB — PREGNANCY, URINE: Preg Test, Ur: NEGATIVE

## 2023-04-20 LAB — MAGNESIUM: Magnesium: 2 mg/dL (ref 1.7–2.4)

## 2023-04-20 LAB — D-DIMER, QUANTITATIVE: D-Dimer, Quant: 0.94 ug/mL-FEU — ABNORMAL HIGH (ref 0.00–0.50)

## 2023-04-20 LAB — SARS CORONAVIRUS 2 BY RT PCR: SARS Coronavirus 2 by RT PCR: NEGATIVE

## 2023-04-20 MED ORDER — ONDANSETRON HCL 4 MG/2ML IJ SOLN: 4.0000 mg | Freq: Once | INTRAMUSCULAR | Status: DC

## 2023-04-20 MED ORDER — POTASSIUM CHLORIDE CRYS ER 20 MEQ PO TBCR
40.0000 meq | EXTENDED_RELEASE_TABLET | Freq: Once | ORAL | Status: AC
  Administered 2023-04-20: 40 meq via ORAL
  Filled 2023-04-20: qty 2

## 2023-04-20 MED ORDER — KETOROLAC TROMETHAMINE 30 MG/ML IJ SOLN: 30.0000 mg | Freq: Once | INTRAMUSCULAR | Status: DC

## 2023-04-20 MED ORDER — SULFAMETHOXAZOLE-TRIMETHOPRIM 800-160 MG PO TABS: 1.0000 | ORAL_TABLET | Freq: Two times a day (BID) | ORAL | 0 refills | Status: AC

## 2023-04-20 MED ORDER — CEPHALEXIN 250 MG PO CAPS
500.0000 mg | ORAL_CAPSULE | Freq: Once | ORAL | Status: AC
  Administered 2023-04-20: 500 mg via ORAL
  Filled 2023-04-20: qty 2

## 2023-04-20 NOTE — ED Provider Notes (Signed)
[place] [facility]ERGENCY DEPARTMENT AT [facility] Provider Note   CSN: [id number] Arrival date & time: 04/20/23  0725     History  Chief Complaint  Patient presents with   Abdominal Pain   Leg Pain    Melinda Frost is a 37 y.o. female.  Pt is a 37 yo female with pmhx significant for htn, obesity, gallstones, gerd, migraines, and anemia.  Pt has been having abd pain for years.  She was supposed to get her gb removed, but never had it done.  Pt said she had to have an ERCP to remove a cbd stone and her bp went too high, so chole was canceled.  She never went back b/c she was worried about her bp.  Pt also has pain to her right tonsil.  No fevers.  She has some pain to her left leg.  Pt thinks leg pain is from work out yesterday.  She is also worried about her potassium levels.  Pt denies any coughing.        Home Medications Prior to Admission medications   Medication Sig Start Date End Date Taking? Authorizing Provider  sulfamethoxazole-trimethoprim (BACTRIM DS) 800-160 MG tablet Take 1 tablet by mouth 2 (two) times daily for 7 days. 04/20/23 04/27/23 Yes Jacalyn Lefevre, MD  hydrochlorothiazide (HYDRODIURIL) 25 MG tablet Take 1 tablet (25 mg total) by mouth daily. 05/26/19   Linwood Dibbles, MD      Allergies    Patient has no known allergies.    Review of Systems   Review of Systems  HENT:  Positive for sore throat.   Gastrointestinal:  Positive for abdominal pain.  Musculoskeletal:        Left leg pain  All other systems reviewed and are negative.   Physical Exam Updated Vital Signs BP (!) 136/91 (BP Location: Right Arm)   Pulse 79   Temp 97.9 F (36.6 C) (Oral)   Resp 15   Ht 5\' 2"  (1.575 m)   Wt 103.9 kg   SpO2 100%   BMI 41.88 kg/m  Physical Exam Vitals and nursing note reviewed.  Constitutional:      Appearance: She is well-developed. She is obese.  HENT:     Head: Normocephalic and atraumatic.     Mouth/Throat:     Mouth: Mucous membranes are  moist.     Pharynx: Oropharynx is clear.  Eyes:     Extraocular Movements: Extraocular movements intact.     Pupils: Pupils are equal, round, and reactive to light.  Cardiovascular:     Rate and Rhythm: Normal rate and regular rhythm.     Heart sounds: Normal heart sounds.  Abdominal:     General: Abdomen is flat. Bowel sounds are normal.     Palpations: Abdomen is soft.     Tenderness: There is abdominal tenderness in the right upper quadrant and epigastric area.  Skin:    General: Skin is warm.     Capillary Refill: Capillary refill takes less than 2 seconds.  Neurological:     General: No focal deficit present.     Mental Status: She is alert and oriented to person, place, and time.  Psychiatric:        Mood and Affect: Mood normal.        Behavior: Behavior normal.     ED Results / Procedures / Treatments   Labs (all labs ordered are listed, but only abnormal results are displayed) Labs Reviewed  COMPREHENSIVE METABOLIC PANEL - Abnormal;  Notable for the following components:      Result Value   Potassium 3.2 (*)    All other components within normal limits  CBC - Abnormal; Notable for the following components:   Hemoglobin 11.8 (*)    HCT 35.7 (*)    All other components within normal limits  URINALYSIS, ROUTINE W REFLEX MICROSCOPIC - Abnormal; Notable for the following components:   APPearance HAZY (*)    Hgb urine dipstick MODERATE (*)    Protein, ur TRACE (*)    Leukocytes,Ua LARGE (*)    Bacteria, UA RARE (*)    All other components within normal limits  D-DIMER, QUANTITATIVE - Abnormal; Notable for the following components:   D-Dimer, Quant 0.94 (*)    All other components within normal limits  GROUP A STREP BY PCR  SARS CORONAVIRUS 2 BY RT PCR  LIPASE, BLOOD  PREGNANCY, URINE  MAGNESIUM    EKG None  Radiology CT Renal Stone Study  Result Date: 04/20/2023 CLINICAL DATA:  Abdominal pain for 1 week. EXAM: CT ABDOMEN AND PELVIS WITHOUT CONTRAST  TECHNIQUE: Multidetector CT imaging of the abdomen and pelvis was performed following the standard protocol without IV contrast. RADIATION DOSE REDUCTION: This exam was performed according to the departmental dose-optimization program which includes automated exposure control, adjustment of the mA and/or kV according to patient size and/or use of iterative reconstruction technique. COMPARISON:  Same-day right upper quadrant ultrasound. FINDINGS: Lower chest: The lung bases are clear. The imaged heart is unremarkable. Hepatobiliary: The liver and gallbladder are unremarkable. There is no biliary ductal dilatation. Pancreas: Unremarkable. Spleen: Unremarkable. Adrenals/Urinary Tract: The adrenals are unremarkable. The kidneys are unremarkable, with no focal lesion, stone, hydronephrosis, or hydroureter. The bladder is decompressed but grossly unremarkable. Stomach/Bowel: The stomach is unremarkable. There is no evidence of bowel obstruction. There is no abnormal bowel wall thickening or inflammatory change. The appendix is normal. Vascular/Lymphatic: The abdominal aorta is normal in course and caliber. There is no abdominal or pelvic lymphadenopathy. Reproductive: The uterus and adnexa are unremarkable. Other: There is small fat containing umbilical hernia and an additional fat containing supraumbilical hernia which protrudes through a 6 mm fascial defect. There is no stranding in the fat to suggest incarceration. There is no ascites or free air. Musculoskeletal: There is no acute osseous abnormality or suspicious osseous lesion. IMPRESSION: 1. No acute findings in the abdomen or pelvis. 2. Small fat containing umbilical and supraumbilical hernias without evidence of incarceration. Electronically Signed   By: Lesia Hausen M.D.   On: 04/20/2023 09:11   US Venous Img Lower Bilateral  Result Date: 04/20/2023 CLINICAL DATA:  pain EXAM: BILATERAL LOWER EXTREMITY VENOUS DOPPLER ULTRASOUND TECHNIQUE: Gray-scale  sonography with graded compression, as well as color Doppler and duplex ultrasound were performed to evaluate the lower extremity deep venous systems from the level of the common femoral vein and including the common femoral, femoral, profunda femoral, popliteal and calf veins including the posterior tibial, peroneal and gastrocnemius veins when visible. The superficial great saphenous vein was also interrogated. Spectral Doppler was utilized to evaluate flow at rest and with distal augmentation maneuvers in the common femoral, femoral and popliteal veins. COMPARISON:  CT AP, concurrent. FINDINGS: RIGHT LOWER EXTREMITY VENOUS Normal compressibility of the RIGHT common femoral, superficial femoral, and popliteal veins, as well as the visualized calf veins. Visualized portions of profunda femoral vein and great saphenous vein unremarkable. No filling defects to suggest DVT on grayscale or color Doppler imaging. Doppler waveforms show  normal direction of venous flow, normal respiratory plasticity and response to augmentation. OTHER No evidence of superficial thrombophlebitis or abnormal fluid collection. Limitations: none LEFT LOWER EXTREMITY VENOUS Normal compressibility of the LEFT common femoral, superficial femoral, and popliteal veins, as well as the visualized calf veins. Visualized portions of profunda femoral vein and great saphenous vein unremarkable. No filling defects to suggest DVT on grayscale or color Doppler imaging. Doppler waveforms show normal direction of venous flow, normal respiratory plasticity and response to augmentation. OTHER No evidence of superficial thrombophlebitis or abnormal fluid collection. Limitations: none IMPRESSION: No evidence of femoropopliteal DVT or superficial thrombophlebitis within either lower extremity. Roanna Banning, MD [facility] Radiology Electronically Signed   By: Roanna Banning M.D.   On: 04/20/2023 09:02   US Abdomen  Limited RUQ (LIVER/GB)  Result Date: 04/20/2023 CLINICAL DATA:  151471 RUQ pain 151471 EXAM: ULTRASOUND ABDOMEN LIMITED RIGHT UPPER QUADRANT COMPARISON:  None Available. FINDINGS: Gallbladder: No gallstones or wall thickening visualized. No sonographic Murphy sign noted by sonographer. Common bile duct: Diameter: 3 mm, which is normal Liver: No focal lesion identified. Within normal limits in parenchymal echogenicity. Portal vein is patent on color Doppler imaging with normal direction of blood flow towards the liver. Other: None. IMPRESSION: No finding to explain right upper quadrant abdominal pain. Electronically Signed   By: Lorenza Cambridge M.D.   On: 04/20/2023 08:31    Procedures Procedures    Medications Ordered in ED Medications  ketorolac (TORADOL) 30 MG/ML injection 30 mg (0 mg Intravenous Hold 04/20/23 0804)  cephALEXin (KEFLEX) capsule 500 mg (has no administration in time range)  potassium chloride SA (KLOR-CON M) CR tablet 40 mEq (has no administration in time range)    ED Course/ Medical Decision Making/ A&P                             Medical Decision Making Amount and/or Complexity of Data Reviewed Labs: ordered. Radiology: ordered.  Risk Prescription drug management.   This patient presents to the ED for concern of abd pain, this involves an extensive number of treatment options, and is a complaint that carries with it a high risk of complications and morbidity.  The differential diagnosis includes cholelithiasis, cholecystitis, uti, gastritis, pregnancy   Co morbidities that complicate the patient evaluation  htn, obesity, gallstones, gerd, migraines, and anemia   Additional history obtained:  Additional history obtained from epic chart review  Lab Tests:  I Ordered, and personally interpreted labs.  The pertinent results include:  preg neg, ddimer elevated at 0.94, ua with lg le, >50 wbcs and wbcs in clumps; mg nl, strep neg, covid neg, cmp with k low at 3.2,  mg nl at 2   Imaging Studies ordered:  I ordered imaging studies including ruq [place] and leg [place]  I independently visualized and interpreted imaging which showed  RUQ [place]: No finding to explain right upper quadrant abdominal pain.  [place]: No evidence of femoropopliteal DVT or superficial thrombophlebitis  within either lower extremity.  CT renal: No acute findings in the abdomen or pelvis.  2. Small fat containing umbilical and supraumbilical hernias without  evidence of incarceration.   I agree with the radiologist interpretation   Cardiac Monitoring:  The patient was maintained on a cardiac monitor.  I personally viewed and interpreted the cardiac monitored which showed an underlying rhythm of: nsr   Medicines ordered and prescription drug management:  I ordered medication including ivfs/toradol/zofran  for sx  Reevaluation of the patient after these medicines showed that the patient improved I have reviewed the patients home medicines and have made adjustments as needed   Test Considered:  [place]   Critical Interventions:  Pain control  Problem List / ED Course:  Abd pain:  pt has had gallstones, but no longer has them on [place].  She does have a uti and could have some mild pyelo.  CT neg for pyelo.  Pt given a dose of keflex here and d/c with bactrim.  Pt thinks pain could be from eating meat.  She has a hx of anemia and is given a diet high in iron so she can do that instead of red meat.  She is also referred to GI. Leg pain:  ddimer +, so US done. [place] neg for dvt. Hypokalemia:  pt is on hctz.  She is given kdur 40 meq here.  She has k at home that she's instructed to use.     Reevaluation:  After the interventions noted above, I reevaluated the patient and found that they have :improved   Social Determinants of Health:  Lives at home   Dispostion:  After consideration of the diagnostic results and the patients response to treatment, I feel that the patent would  benefit from discharge with outpatient f/u.  Return if worse.          Final Clinical Impression(s) / ED Diagnoses Final diagnoses:  Acute cystitis without hematuria  Hypokalemia  Right upper quadrant abdominal pain    Rx / DC Orders ED Discharge Orders          Ordered    sulfamethoxazole-trimethoprim (BACTRIM DS) 800-160 MG tablet  2 times daily        04/20/23 0922    Ambulatory referral to Gastroenterology        04/20/23 0930              Jacalyn Lefevre, MD 04/20/23 [phone]

## 2023-04-20 NOTE — ED Triage Notes (Signed)
Pt arrives to ED with c/o abdominal pain x1 week, tonsil pain,  and left leg pain since yesterday.

## 2023-04-20 NOTE — Discharge Instructions (Signed)
Foods High In Both Magnesium and Potassium Spinach in particular is an excellent source of both magnesium and potassium. But don't forget fruits (avocado, banana, apple), starchy vegetables (potatoes, yams, carrots), legumes, and meat and fish like chicken, beef, and salmon. 

## 2023-04-22 ENCOUNTER — Ambulatory Visit: Payer: Self-pay

## 2023-05-01 ENCOUNTER — Emergency Department (HOSPITAL_BASED_OUTPATIENT_CLINIC_OR_DEPARTMENT_OTHER)
Admission: EM | Admit: 2023-05-01 | Discharge: 2023-05-01 | Disposition: A | Discharge status: Home / Self Care | Payer: Managed Care, Other (non HMO) | Attending: Emergency Medicine | Admitting: Emergency Medicine

## 2023-05-01 ENCOUNTER — Emergency Department (HOSPITAL_BASED_OUTPATIENT_CLINIC_OR_DEPARTMENT_OTHER): Payer: Managed Care, Other (non HMO) | Admitting: Radiology

## 2023-05-01 ENCOUNTER — Encounter (HOSPITAL_BASED_OUTPATIENT_CLINIC_OR_DEPARTMENT_OTHER): Payer: Self-pay | Admitting: Emergency Medicine

## 2023-05-01 ENCOUNTER — Other Ambulatory Visit: Payer: Self-pay

## 2023-05-01 DIAGNOSIS — R0600 Dyspnea, unspecified: Secondary | ICD-10-CM | POA: Diagnosis present

## 2023-05-01 MED ORDER — HYDROXYZINE HCL 25 MG PO TABS: 25.0000 mg | ORAL_TABLET | Freq: Four times a day (QID) | ORAL | 0 refills | Status: AC

## 2023-05-01 NOTE — Discharge Instructions (Signed)
Your workup today was overall reassuring.  No concerning cause of your symptoms.  We did discuss obtaining additional blood work however you state you are sure that this may be related to your anxiety and would not like to have the blood work done.  Chest x-ray did not show anything concerning.  EKG did not show any concerning findings.  I have sent medication called hydroxyzine to help with your anxiety to the pharmacy.  You can take this as needed every 8 hours.  Follow-up with your gynecologist as needed for your menstrual cycle concern.  Follow-up with your primary care provider.  For any concerning symptoms return to the emergency room.

## 2023-05-01 NOTE — ED Provider Notes (Signed)
[place] EMERGENCY DEPARTMENT AT [facility] Provider Note   CSN: [id number] Arrival date & time: 05/01/23  1300     History  Chief Complaint  Patient presents with   Shortness of Breath    Melinda Frost is a 37 y.o. female.  37 year old female presents today for evaluation of shortness of breath, and fatigue which has been going on for 1 week.  She denies any chest pain, fever, productive cough, history of PE or DVT.  She states she has history of anxiety and panic attacks.  Believes she had a panic attack on Sunday.  She states she is concerned about her menstrual cycle.  She states that is different than usual.  She is concerned she may be pregnant.  She states however that it is too soon to find out.  She states she has seen her gynecologist regarding this.  She states nonetheless she is still concerned about it.  The history is provided by the patient. No language interpreter was used.       Home Medications Prior to Admission medications   Medication Sig Start Date End Date Taking? Authorizing Provider  hydrOXYzine (ATARAX) 25 MG tablet Take 1 tablet (25 mg total) by mouth every 6 (six) hours. 05/01/23  Yes Karie Mainland, Fransico Sciandra, PA-C  hydrochlorothiazide (HYDRODIURIL) 25 MG tablet Take 1 tablet (25 mg total) by mouth daily. 05/26/19   Linwood Dibbles, MD      Allergies    Patient has no known allergies.    Review of Systems   Review of Systems  Constitutional:  Positive for fatigue. Negative for fever.  HENT:  Negative for congestion.   Respiratory:  Positive for shortness of breath. Negative for cough.   Gastrointestinal:  Negative for abdominal pain.  All other systems reviewed and are negative.   Physical Exam Updated Vital Signs BP 126/87 (BP Location: Right Arm)   Pulse 65   Temp 98.4 F (36.9 C) (Oral)   Resp 16   Ht 5\' 2"  (1.575 m)   Wt 103 kg   SpO2 100%   BMI 41.52 kg/m  Physical Exam Vitals and nursing note reviewed.  Constitutional:      General:  She is not in acute distress.    Appearance: Normal appearance. She is not ill-appearing.  HENT:     Head: Normocephalic and atraumatic.     Nose: Nose normal.  Eyes:     General: No scleral icterus.    Extraocular Movements: Extraocular movements intact.     Conjunctiva/sclera: Conjunctivae normal.  Cardiovascular:     Rate and Rhythm: Normal rate and regular rhythm.     Heart sounds: Normal heart sounds.  Pulmonary:     Effort: Pulmonary effort is normal. No respiratory distress.     Breath sounds: Normal breath sounds. No wheezing or rales.  Abdominal:     General: There is no distension.     Tenderness: There is no abdominal tenderness.  Musculoskeletal:        General: Normal range of motion.     Cervical back: Normal range of motion.     Right lower leg: No edema.     Left lower leg: No edema.  Skin:    General: Skin is warm and dry.  Neurological:     General: No focal deficit present.     Mental Status: She is alert and oriented to person, place, and time. Mental status is at baseline.     ED Results / Procedures /  Treatments   Labs (all labs ordered are listed, but only abnormal results are displayed) Labs Reviewed - No data to display  EKG None  Radiology DG Chest 2 View  Result Date: 05/01/2023 CLINICAL DATA:  Shortness of breath EXAM: CHEST - 2 VIEW COMPARISON:  Chest radiograph dated December 13, 2022 FINDINGS: The heart size and mediastinal contours are within normal limits. Both lungs are clear. The visualized skeletal structures are unremarkable. IMPRESSION: No active cardiopulmonary disease. Electronically Signed   By: Larose Hires D.O.   On: 05/01/2023 13:40    Procedures Procedures    Medications Ordered in ED Medications - No data to display  ED Course/ Medical Decision Making/ A&P                             Medical Decision Making Amount and/or Complexity of Data Reviewed Radiology: ordered.  Risk Prescription drug  management.   37 year old female presents today for concern of fatigue, shortness of breath.  She states fatigue has been ongoing for about a week.  Shortness of breath in the past few days.  No associated chest pain..  She is concerned this is associated with her anxiety and panic attacks.  She believes she had a panic attack on Sunday.  She is concerned about her menstrual cycle and possible pregnancy.  Does not want a pregnancy test currently.  States it may be too soon.  She is following with her gynecologist from this standpoint.  Chest x-ray was obtained through triage.  Does not show any acute cardiopulmonary process.  We discussed obtaining blood work, EKG, as well as additional workup however patient defers.  She is agreeable to EKG to rule out acute ischemic changes.  Otherwise she feels this is associated with her anxiety and would like medication to help with her anxiety while she waits out to see what is going on with her menstrual cycle.  We discussed return precautions.  Patient voices understanding and is in agreement with plan. EKG without acute ischemic changes.    Final Clinical Impression(s) / ED Diagnoses Final diagnoses:  Dyspnea, unspecified type    Rx / DC Orders ED Discharge Orders          Ordered    hydrOXYzine (ATARAX) 25 MG tablet  Every 6 hours        06 /25/24 1429              Marita Kansas, PA-C 05/01/23 1456    Derwood Kaplan, MD 05/02/23 1142

## 2023-05-01 NOTE — ED Triage Notes (Signed)
Pt arrives to ED with c/o SOB and fatigue x3 days. Pt notes she feels like this related to her anxiety.

## 2023-05-23 ENCOUNTER — Other Ambulatory Visit: Payer: Self-pay

## 2023-05-23 ENCOUNTER — Other Ambulatory Visit (HOSPITAL_BASED_OUTPATIENT_CLINIC_OR_DEPARTMENT_OTHER): Payer: Self-pay

## 2023-05-23 ENCOUNTER — Encounter (HOSPITAL_BASED_OUTPATIENT_CLINIC_OR_DEPARTMENT_OTHER): Payer: Self-pay

## 2023-05-23 ENCOUNTER — Emergency Department (HOSPITAL_BASED_OUTPATIENT_CLINIC_OR_DEPARTMENT_OTHER)
Admission: EM | Admit: 2023-05-23 | Discharge: 2023-05-23 | Disposition: A | Discharge status: Home / Self Care | Payer: Managed Care, Other (non HMO) | Attending: Emergency Medicine | Admitting: Emergency Medicine

## 2023-05-23 DIAGNOSIS — J029 Acute pharyngitis, unspecified: Secondary | ICD-10-CM | POA: Insufficient documentation

## 2023-05-23 LAB — GROUP A STREP BY PCR: Group A Strep by PCR: NOT DETECTED

## 2023-05-23 MED ORDER — LIDOCAINE VISCOUS HCL 2 % MT SOLN
15.0000 mL | Freq: Once | OROMUCOSAL | Status: AC
  Administered 2023-05-23: 15 mL via OROMUCOSAL
  Filled 2023-05-23: qty 15

## 2023-05-23 MED ORDER — PANTOPRAZOLE SODIUM 20 MG PO TBEC
20.0000 mg | DELAYED_RELEASE_TABLET | Freq: Every day | ORAL | 0 refills | Status: AC
  Filled 2023-05-23: qty 30, 30d supply, fill #0

## 2023-05-23 NOTE — Discharge Instructions (Signed)
You have been evaluated for your sore throat.  Fortunately your strep test came back negative.  Your sore throat was likely due to heartburn.  You may take Protonix daily and follow-up with your doctor for further care.

## 2023-05-23 NOTE — ED Provider Notes (Signed)
EMERGENCY DEPARTMENT AT [facility] Provider Note   CSN: [id number] Arrival date & time: 05/23/23  4098     History  No chief complaint on file.   Melinda Frost is a 37 y.o. Frost.  The history is provided by the patient and medical records. No language interpreter was used.     37 year old Frost significant history of GERD, obesity, gallstone, anxiety, presenting complaining of sore throat.  Patient mention for the past week and a half she has had throat irritation described as a soreness sensation more noticeable in the morning.  She also complaining of some burning sensation in her chest that felt similar to heartburn which seems to improve when she takes her acid blocker.  She states that the heartburn has since resolved but the sore throat is still lingering.  She also feels fatigued.  She does not endorse any fever runny nose sneezing ear pain body aches chest pain or shortness of breath.  She mention several weeks ago she tested positive for gonorrhea and did receive an antibiotic injection.  She still voiced concerns for potential STI causing her sore throat since she did perform oral sex.  She was wondering if the antibiotic shot covers for her infection.  She denies any other sexual activity since the last diagnosis.  Home Medications Prior to Admission medications   Medication Sig Start Date End Date Taking? Authorizing Provider  hydrochlorothiazide (HYDRODIURIL) 25 MG tablet Take 1 tablet (25 mg total) by mouth daily. 05/26/19   Linwood Dibbles, MD  hydrOXYzine (ATARAX) 25 MG tablet Take 1 tablet (25 mg total) by mouth every 6 (six) hours. 05/01/23   Marita Kansas, PA-C      Allergies    Patient has no known allergies.    Review of Systems   Review of Systems  All other systems reviewed and are negative.   Physical Exam Updated Vital Signs BP 125/86 (BP Location: Right Arm)   Pulse 85   Temp 98.4 F (36.9 C) (Oral)   Resp 18   LMP  (LMP Unknown)    SpO2 100%  Physical Exam Vitals and nursing note reviewed.  Constitutional:      General: She is not in acute distress.    Appearance: She is well-developed.  HENT:     Head: Atraumatic.     Nose: Nose normal.     Mouth/Throat:     Mouth: Mucous membranes are moist.     Pharynx: Posterior oropharyngeal erythema (Minimal bilateral erythema at the tonsillar pillar without significant tonsillitis) present. No oropharyngeal exudate.  Eyes:     Conjunctiva/sclera: Conjunctivae normal.  Cardiovascular:     Rate and Rhythm: Normal rate and regular rhythm.     Pulses: Normal pulses.     Heart sounds: Normal heart sounds.  Pulmonary:     Effort: Pulmonary effort is normal.  Musculoskeletal:     Cervical back: Normal range of motion and neck supple. No rigidity or tenderness.  Lymphadenopathy:     Cervical: No cervical adenopathy.  Skin:    Findings: No rash.  Neurological:     Mental Status: She is alert.  Psychiatric:        Mood and Affect: Mood normal.     ED Results / Procedures / Treatments   Labs (all labs ordered are listed, but only abnormal results are displayed) Labs Reviewed  GROUP A STREP BY PCR    EKG None  Radiology No results found.  Procedures Procedures  Medications Ordered in ED Medications  lidocaine (XYLOCAINE) 2 % viscous mouth solution 15 mL (15 mLs Mouth/Throat Given 05/23/23 9528)    ED Course/ Medical Decision Making/ A&P                             Medical Decision Making Risk Prescription drug management.   BP 125/86 (BP Location: Right Arm)   Pulse 85   Temp 98.4 F (36.9 C) (Oral)   Resp 18   LMP  (LMP Unknown)   SpO2 100%   Melinda:71 AM  37 year old Frost significant history of GERD, obesity, gallstone, anxiety, presenting complaining of sore throat.  Patient mention for the past week and a half she has had throat irritation described as a soreness sensation more noticeable in the morning.  She also complaining of some burning  sensation in her chest that felt similar to heartburn which seems to improve when she takes her acid blocker.  She states that the heartburn has since resolved but the sore throat is still lingering.  She also feels fatigued.  She does not endorse any fever runny nose sneezing ear pain body aches chest pain or shortness of breath.  She mention several weeks ago she tested positive for gonorrhea and did receive an antibiotic injection.  She still voiced concerns for potential STI causing her sore throat since she did perform oral sex.  She was wondering if the antibiotic shot covers for her infection.  She denies any other sexual activity since the last diagnosis.  On exam well-appearing Frost resting comfortably in bed appears to be in no acute discomfort.  Ear nose and throat exam overall reassuring.  Mild erythema noted to bilateral tonsillar pillar but no exudates no tonsillar enlargement and she has normal phonation.  Airway intact.  Heart lung sounds normal lungs are clear.  DDx: GERD, esophagitis, strep infection, GC/chlamydia pharyngitis, viral illness, mono, peritonsillar abscess, retropharyngeal abscess.  Sore throat was likely secondary to acid reflux and less likely to be infectious etiology as patient is without any other infectious symptoms.  She reports concerns for potential lingering STI causing her sore throat.  I felt patient did receive appropriate treatment which includes Rocephin 500 mg IM after she was evaluated for concerns of STI.  EMR reviewed patient did test positive for Gonorrhea on 05/03/2023.  Strep test ordered.  Patient given viscous lidocaine.  10:10 AM Strep test viewed and interpreted by me and is negative.  Patient report improvement of sore throat with viscous lidocaine.  Suspect her symptoms likely GERD.  Will discharge home with PPI.  Return precaution given.  Social determinant of health including stress, insufficient activity.  Last admission considered but patient  stable to be discharged home as there are no airway compromise and she is overall well-appearing.        Final Clinical Impression(s) / ED Diagnoses Final diagnoses:  Sore throat    Rx / DC Orders ED Discharge Orders          Ordered    pantoprazole (PROTONIX) 20 MG tablet  Daily        05/23/23 1012              Fayrene Helper, PA-C 05/23/23 1015    Melene Plan, DO 05/23/23 1031

## 2023-05-25 ENCOUNTER — Encounter (HOSPITAL_BASED_OUTPATIENT_CLINIC_OR_DEPARTMENT_OTHER): Payer: Self-pay | Admitting: Emergency Medicine

## 2023-05-25 ENCOUNTER — Emergency Department (HOSPITAL_BASED_OUTPATIENT_CLINIC_OR_DEPARTMENT_OTHER)
Admission: EM | Admit: 2023-05-25 | Discharge: 2023-05-25 | Disposition: A | Discharge status: Home / Self Care | Payer: Managed Care, Other (non HMO) | Attending: Emergency Medicine | Admitting: Emergency Medicine

## 2023-05-25 ENCOUNTER — Other Ambulatory Visit: Payer: Self-pay

## 2023-05-25 DIAGNOSIS — E876 Hypokalemia: Secondary | ICD-10-CM | POA: Insufficient documentation

## 2023-05-25 DIAGNOSIS — K2100 Gastro-esophageal reflux disease with esophagitis, without bleeding: Secondary | ICD-10-CM | POA: Diagnosis not present

## 2023-05-25 DIAGNOSIS — R5383 Other fatigue: Secondary | ICD-10-CM

## 2023-05-25 LAB — CBC WITH DIFFERENTIAL/PLATELET
Abs Immature Granulocytes: 0.02 10*3/uL (ref 0.00–0.07)
Basophils Absolute: 0 10*3/uL (ref 0.0–0.1)
Basophils Relative: 0 %
Eosinophils Absolute: 0 10*3/uL (ref 0.0–0.5)
Eosinophils Relative: 0 %
HCT: 36.7 % (ref 36.0–46.0)
Hemoglobin: 11.5 g/dL — ABNORMAL LOW (ref 12.0–15.0)
Immature Granulocytes: 0 %
Lymphocytes Relative: 22 %
Lymphs Abs: 1.5 10*3/uL (ref 0.7–4.0)
MCH: 26.9 pg (ref 26.0–34.0)
MCHC: 31.3 g/dL (ref 30.0–36.0)
MCV: 85.7 fL (ref 80.0–100.0)
Monocytes Absolute: 0.5 10*3/uL (ref 0.1–1.0)
Monocytes Relative: 7 %
Neutro Abs: 5 10*3/uL (ref 1.7–7.7)
Neutrophils Relative %: 71 %
Platelets: 268 10*3/uL (ref 150–400)
RBC: 4.28 MIL/uL (ref 3.87–5.11)
RDW: 14.9 % (ref 11.5–15.5)
WBC: 7 10*3/uL (ref 4.0–10.5)
nRBC: 0 % (ref 0.0–0.2)

## 2023-05-25 LAB — BASIC METABOLIC PANEL
Anion gap: 10 (ref 5–15)
BUN: 10 mg/dL (ref 6–20)
CO2: 27 mmol/L (ref 22–32)
Calcium: 9.4 mg/dL (ref 8.9–10.3)
Chloride: 102 mmol/L (ref 98–111)
Creatinine, Ser: 0.75 mg/dL (ref 0.44–1.00)
GFR, Estimated: >60 mL/min (ref >60)
Glucose, Bld: 89 mg/dL (ref 70–99)
Potassium: 3.3 mmol/L — ABNORMAL LOW (ref 3.5–5.1)
Sodium: 139 mmol/L (ref 135–145)

## 2023-05-25 LAB — MAGNESIUM: Magnesium: 1.9 mg/dL (ref 1.7–2.4)

## 2023-05-25 LAB — RAPID HIV SCREEN (HIV 1/2 AB+AG)
HIV 1/2 Antibodies: NONREACTIVE
HIV-1 P24 Antigen - HIV24: NONREACTIVE

## 2023-05-25 LAB — PREGNANCY, URINE: Preg Test, Ur: NEGATIVE

## 2023-05-25 MED ORDER — POTASSIUM CHLORIDE CRYS ER 20 MEQ PO TBCR
40.0000 meq | EXTENDED_RELEASE_TABLET | Freq: Once | ORAL | Status: AC
  Administered 2023-05-25: 40 meq via ORAL

## 2023-05-25 MED ORDER — FAMOTIDINE 20 MG PO TABS
20.0000 mg | ORAL_TABLET | Freq: Once | ORAL | Status: AC
  Administered 2023-05-25: 20 mg via ORAL

## 2023-05-25 NOTE — ED Triage Notes (Signed)
Pt arrives to ED with c/o on-going heartburn. Pt notes was seen for same on 7/17. Pt also notes fatigue and desire for an HIV blood test.

## 2023-05-25 NOTE — ED Notes (Signed)
Patient verbalizes understanding of discharge instructions. Opportunity for questioning and answers were provided. Patient discharged from ED.  °

## 2023-05-25 NOTE — ED Provider Notes (Signed)
[place] EMERGENCY DEPARTMENT AT [facility] Provider Note   CSN: [id number] Arrival date & time: 05/25/23  0800     History  Chief Complaint  Patient presents with   Heartburn   Fatigue    Melinda Frost is a 37 y.o. female.  Patient presents with burning sensation/ epigastric similar to previous reflux. No blood in stools. Hx of anemia. Pt has protonix but has not started since wanted to check if ok with hydrochlorothiazide. No cardiac hx. No chest pain.        Home Medications Prior to Admission medications   Medication Sig Start Date End Date Taking? Authorizing Provider  hydrochlorothiazide (HYDRODIURIL) 25 MG tablet Take 1 tablet (25 mg total) by mouth daily. 05/26/19   Linwood Dibbles, MD  hydrOXYzine (ATARAX) 25 MG tablet Take 1 tablet (25 mg total) by mouth every 6 (six) hours. 05/01/23   Karie Mainland, Amjad, PA-C  pantoprazole (PROTONIX) 20 MG tablet Take 1 tablet (20 mg total) by mouth daily. 05/23/23   Fayrene Helper, PA-C      Allergies    Patient has no known allergies.    Review of Systems   Review of Systems  Constitutional:  Negative for chills and fever.  HENT:  Negative for congestion.   Eyes:  Negative for visual disturbance.  Respiratory:  Negative for shortness of breath.   Cardiovascular:  Negative for chest pain.  Gastrointestinal:  Positive for abdominal pain. Negative for vomiting.  Genitourinary:  Negative for dysuria and flank pain.  Musculoskeletal:  Negative for back pain, neck pain and neck stiffness.  Skin:  Negative for rash.  Neurological:  Negative for light-headedness and headaches.    Physical Exam Updated Vital Signs BP (!) 131/91   Pulse 95   Temp 98 F (36.7 C) (Oral)   Resp 15   LMP  (LMP Unknown)   SpO2 100%  Physical Exam Vitals and nursing note reviewed.  Constitutional:      General: She is not in acute distress.    Appearance: She is well-developed.  HENT:     Head: Normocephalic and atraumatic.     Mouth/Throat:      Mouth: Mucous membranes are moist.  Eyes:     General:        Right eye: No discharge.        Left eye: No discharge.     Conjunctiva/sclera: Conjunctivae normal.  Neck:     Trachea: No tracheal deviation.  Cardiovascular:     Rate and Rhythm: Normal rate.  Pulmonary:     Effort: Pulmonary effort is normal.  Abdominal:     General: There is no distension.     Palpations: Abdomen is soft.     Tenderness: There is no abdominal tenderness. There is no guarding.  Musculoskeletal:     Cervical back: Normal range of motion and neck supple. No rigidity.  Skin:    General: Skin is warm.     Capillary Refill: Capillary refill takes less than 2 seconds.  Neurological:     General: No focal deficit present.     Mental Status: She is alert.  Psychiatric:        Mood and Affect: Mood normal.     ED Results / Procedures / Treatments   Labs (all labs ordered are listed, but only abnormal results are displayed) Labs Reviewed  CBC WITH DIFFERENTIAL/PLATELET - Abnormal; Notable for the following components:      Result Value   Hemoglobin 11.5 (*)  All other components within normal limits  BASIC METABOLIC PANEL - Abnormal; Notable for the following components:   Potassium 3.3 (*)    All other components within normal limits  RAPID HIV SCREEN (HIV 1/2 AB+AG)  MAGNESIUM  PREGNANCY, URINE    EKG None  Radiology No results found.  Procedures Procedures    Medications Ordered in ED Medications  potassium chloride SA (KLOR-CON M) CR tablet 40 mEq (has no administration in time range)  famotidine (PEPCID) tablet 20 mg (20 mg Oral Given 05/25/23 0954)    ED Course/ Medical Decision Making/ A&P                             Medical Decision Making Amount and/or Complexity of Data Reviewed Labs: ordered.  Risk Prescription drug management.   Patient presents with clinical concern for uncontrolled reflux, other differentials include gallstones, gastritis, ulcers, msk,  anemia secondarily, other.   Plan for labs to check electrolytes, pepcid for symptoms, check for anemia. Reviewed uptodate and safe to take protonix with hydrochlorothiazide.  Labs independently reviewed K mild low, po ordered. Hb 11 similar to previous. Pregnancy test and HIV negative. Pt stable for dc, work note given.        Final Clinical Impression(s) / ED Diagnoses Final diagnoses:  Fatigue, unspecified type  Gastroesophageal reflux disease with esophagitis without hemorrhage  Hypokalemia    Rx / DC Orders ED Discharge Orders     None         Blane Ohara, MD 05/25/23 1109

## 2023-05-25 NOTE — Discharge Instructions (Addendum)
Take your protonix as directed it is safe with your blood pressure medicine. Your blood work was okay except mild low potassium and HIV test was negative Tylenol every 4 hrs for pain. See your doctor for recheck.

## 2023-07-09 ENCOUNTER — Other Ambulatory Visit: Payer: Self-pay

## 2023-07-09 ENCOUNTER — Encounter (HOSPITAL_BASED_OUTPATIENT_CLINIC_OR_DEPARTMENT_OTHER): Payer: Self-pay | Admitting: Emergency Medicine

## 2023-07-09 ENCOUNTER — Emergency Department (HOSPITAL_BASED_OUTPATIENT_CLINIC_OR_DEPARTMENT_OTHER): Payer: Managed Care, Other (non HMO) | Admitting: Radiology

## 2023-07-09 ENCOUNTER — Emergency Department (HOSPITAL_BASED_OUTPATIENT_CLINIC_OR_DEPARTMENT_OTHER)
Admission: EM | Admit: 2023-07-09 | Discharge: 2023-07-09 | Disposition: A | Discharge status: Home / Self Care | Payer: Managed Care, Other (non HMO) | Attending: Emergency Medicine | Admitting: Emergency Medicine

## 2023-07-09 DIAGNOSIS — B349 Viral infection, unspecified: Secondary | ICD-10-CM | POA: Insufficient documentation

## 2023-07-09 DIAGNOSIS — Z20822 Contact with and (suspected) exposure to covid-19: Secondary | ICD-10-CM | POA: Insufficient documentation

## 2023-07-09 DIAGNOSIS — R051 Acute cough: Secondary | ICD-10-CM

## 2023-07-09 LAB — SARS CORONAVIRUS 2 BY RT PCR: SARS Coronavirus 2 by RT PCR: NEGATIVE

## 2023-07-09 NOTE — ED Triage Notes (Signed)
Pt arrives to ED with c/o cough x3 days.

## 2023-07-09 NOTE — ED Notes (Signed)
Patient verbalizes understanding of discharge instructions. Opportunity for questioning and answers were provided. Patient discharged from ED.  °

## 2023-07-09 NOTE — Discharge Instructions (Addendum)
Your xray did not show signs of pneumonia at this time.  Please continue using over the counter medications as needed for cough and follow up with your primary care clinic.

## 2023-07-09 NOTE — ED Provider Notes (Signed)
[name] EMERGENCY DEPARTMENT AT [facility] Provider Note   CSN: [id number] Arrival date & time: 07/09/23  4098     History  Chief Complaint  Patient presents with   Cough    Melinda Frost is a 37 y.o. female presented to ED with new onset of cough in the setting of recent "viral syndrome".  Patient reports she had congestion and runny nose and mild headache for several days but developed a cough that seems to be worsening over the past 1 to 2 days, with sputum production.  HPI     Home Medications Prior to Admission medications   Medication Sig Start Date End Date Taking? Authorizing Provider  hydrochlorothiazide (HYDRODIURIL) 25 MG tablet Take 1 tablet (25 mg total) by mouth daily. 05/26/19   Linwood Dibbles, MD  hydrOXYzine (ATARAX) 25 MG tablet Take 1 tablet (25 mg total) by mouth every 6 (six) hours. 05/01/23   Karie Mainland, Amjad, PA-C  pantoprazole (PROTONIX) 20 MG tablet Take 1 tablet (20 mg total) by mouth daily. 05/23/23   Fayrene Helper, PA-C      Allergies    Patient has no known allergies.    Review of Systems   Review of Systems  Physical Exam Updated Vital Signs BP (!) 133/90 (BP Location: Right Arm)   Pulse 97   Temp 98 F (36.7 C) (Oral)   Resp 15   Ht 5\' 2"  (1.575 m)   Wt 103 kg   SpO2 98%   BMI 41.53 kg/m  Physical Exam Constitutional:      General: She is not in acute distress. HENT:     Head: Normocephalic and atraumatic.  Eyes:     Conjunctiva/sclera: Conjunctivae normal.     Pupils: Pupils are equal, round, and reactive to light.  Cardiovascular:     Rate and Rhythm: Normal rate and regular rhythm.  Pulmonary:     Effort: Pulmonary effort is normal. No respiratory distress.     Breath sounds: Normal breath sounds. No wheezing.  Abdominal:     General: There is no distension.     Tenderness: There is no abdominal tenderness.  Skin:    General: Skin is warm and dry.  Neurological:     General: No focal deficit present.     Mental  Status: She is alert. Mental status is at baseline.  Psychiatric:        Mood and Affect: Mood normal.        Behavior: Behavior normal.     ED Results / Procedures / Treatments   Labs (all labs ordered are listed, but only abnormal results are displayed) Labs Reviewed  SARS CORONAVIRUS 2 BY RT PCR    EKG None  Radiology DG Chest 2 View  Result Date: 07/09/2023 CLINICAL DATA:  37 year old female with history of cough and sore throat for the past 3 days. EXAM: CHEST - 2 VIEW COMPARISON:  Chest x-ray 05/01/2023. FINDINGS: Lung volumes are normal. No consolidative airspace disease. No pleural effusions. No pneumothorax. No pulmonary nodule or mass noted. Pulmonary vasculature and the cardiomediastinal silhouette are within normal limits. IMPRESSION: No radiographic evidence of acute cardiopulmonary disease. Electronically Signed   By: Trudie Reed M.D.   On: 07/09/2023 08:32    Procedures Procedures    Medications Ordered in ED Medications - No data to display  ED Course/ Medical Decision Making/ A&P  Medical Decision Making Amount and/or Complexity of Data Reviewed Radiology: ordered.   Patient is presenting to ED with cough.  This may be a postviral syndrome versus postnasal drip versus superimposed infection or bacterial infection.  Her COVID test is negative.  I think it is reasonable to add a chest x-ray to evaluate for underlying bacterial infection and the patient is in agreement.  X-ray was ordered, personally viewed interpreted, notable for no emergent findings.  The patient is otherwise not hypoxic.  She is in no respiratory distress.  She is not wheezing.  Have a low suspicion for asthma exacerbation or onset.  No indication for hospitalization or antibiotics at this time.  I suspect this is a viral syndrome and recommended continued supportive care at home.  Patient verbalized understanding.  She already has Lawyer and  over-the-counter medications at home.        Final Clinical Impression(s) / ED Diagnoses Final diagnoses:  Viral syndrome  Acute cough    Rx / DC Orders ED Discharge Orders     None         Terald Sleeper, MD 07/09/23 [id number]

## 2023-07-19 ENCOUNTER — Encounter (HOSPITAL_BASED_OUTPATIENT_CLINIC_OR_DEPARTMENT_OTHER): Payer: Self-pay

## 2023-07-19 ENCOUNTER — Other Ambulatory Visit: Payer: Self-pay

## 2023-07-19 ENCOUNTER — Emergency Department (HOSPITAL_BASED_OUTPATIENT_CLINIC_OR_DEPARTMENT_OTHER)
Admission: EM | Admit: 2023-07-19 | Discharge: 2023-07-19 | Discharge status: Against Medical Advice | Payer: Self-pay | Attending: Emergency Medicine | Admitting: Emergency Medicine

## 2023-07-19 DIAGNOSIS — N63 Unspecified lump in unspecified breast: Secondary | ICD-10-CM | POA: Insufficient documentation

## 2023-07-19 DIAGNOSIS — Z5321 Procedure and treatment not carried out due to patient leaving prior to being seen by health care provider: Secondary | ICD-10-CM | POA: Insufficient documentation

## 2023-07-19 NOTE — ED Notes (Signed)
Pt called x3 separate times with no response. Not visualized in waiting room. Registration staff advised they saw pt leave.

## 2023-07-19 NOTE — ED Triage Notes (Signed)
Pt reports a red, painful mass on her R breast that has a drainage. Pt had a breast reduction 1 year ago. Denies fevers.

## 2023-10-16 ENCOUNTER — Ambulatory Visit (HOSPITAL_COMMUNITY): Payer: Self-pay

## 2024-03-28 ENCOUNTER — Encounter (HOSPITAL_BASED_OUTPATIENT_CLINIC_OR_DEPARTMENT_OTHER): Payer: Self-pay | Admitting: Plastic Surgery

## 2024-04-02 ENCOUNTER — Encounter (HOSPITAL_BASED_OUTPATIENT_CLINIC_OR_DEPARTMENT_OTHER)
Admission: RE | Admit: 2024-04-02 | Discharge: 2024-04-02 | Disposition: A | Discharge status: Home / Self Care | Source: Ambulatory Visit | Attending: Plastic Surgery | Admitting: Plastic Surgery

## 2024-04-02 DIAGNOSIS — Z01812 Encounter for preprocedural laboratory examination: Secondary | ICD-10-CM | POA: Insufficient documentation

## 2024-04-02 LAB — BASIC METABOLIC PANEL WITH GFR
Anion gap: 5 (ref 5–15)
BUN: 9 mg/dL (ref 6–20)
CO2: 26 mmol/L (ref 22–32)
Calcium: 9 mg/dL (ref 8.9–10.3)
Chloride: 107 mmol/L (ref 98–111)
Creatinine, Ser: 0.65 mg/dL (ref 0.44–1.00)
GFR, Estimated: >60 mL/min (ref >60)
Glucose, Bld: 87 mg/dL (ref 70–99)
Potassium: 3.6 mmol/L (ref 3.5–5.1)
Sodium: 138 mmol/L (ref 135–145)

## 2024-04-02 MED ORDER — CHLORHEXIDINE GLUCONATE CLOTH 2 % EX PADS: 6.0000 | MEDICATED_PAD | Freq: Once | CUTANEOUS | Status: DC

## 2024-04-02 NOTE — Progress Notes (Signed)

## 2024-04-02 NOTE — H&P (Signed)
 Subjective Patient ID: Melinda Frost is a 38 y.o. female.     HPI   2 years post op bilateral breast reduction. Developed full thickness wound/loss right lateral NAC following surgery. This has had a prolonged healing course, patient declined excision/revision scar 2024. This area was healed at visit 07/2023. Patient reported recurrent right breast drainage starting April 2025.Melinda Frost Reports area intermittently opens, more frequently after she lays on right side. Returns today reporting new wound developed last week. Patient reports she went to [facility] ED. No records of this available. Patient reports prescribed antibiotics  (Bactrim )but patient did not take due to interaction with potassium medication. However patient denied taking potassium on intake today. Patient also reports today her workplace is requesting she take 4 weeks leave from upcoming procedure.    Prior DDD. Right reduction 827 g Left reduction 693 g   MMG 02/2024 as below. Denies FH breast or ovarian ca.   Moved to [place]. Now works for [employer]. Lives with son. Sister is in [facility] Breast Diag Francene Ing Order: [id number] Impression   IMPRESSION: Post reduction mammoplasty with fat necrosis bilaterally.  RECOMMENDATION: Recommend follow-up bilateral mammogram with magnification views on the right in 6 months. Note: Ultrasound would not be beneficial in this patient. The patient was informed of these results and recommendations at the time of the visit.  ACR BI-RADS CATEGORY 3 -- Probably Benign Letter Sent: Diagnostic Follow-up  Electronically Signed by: Steffi Edu, MD on 02/29/2024 8:05 AM Narrative   EXAM: BILATERAL DIGITAL DIAGNOSTIC MAMMOGRAM with CAD With 3-D Tomosynthesis  INDICATION: 38 year old post reduction mammoplasty in 2023. Diffuse right breast lumpiness and tenderness. Sr. with breast cancer and 40.  TECHNIQUE: Three-view mammogram with exaggerated lateral view left and  magnification views on the right Digital breast tomosynthesis (3-D mammography) was performed. This mammogram was analyzed by a CAD (Computer-Aided Detection) system.  COMPARISON: 12/17/2019  TISSUE DENSITY: The breasts are heterogeneously dense, which may obscure small masses.  FINDINGS: Architectural changes compatible with a reduction mammoplasty. Oil cysts and dystrophic calcifications are seen bilaterally, right greater than left. Periareolar skin retraction on the right. No suspicious breast mass or malignant appearing calcifications are seen in either breast.     Exam End: 02/29/24 07:48    Specimen Collected: 02/29/24 07:54 Last Resulted: 02/29/24 08:05  Received From: [facility]  Result Received: 02/29/24 12:12     Review of Systems   Objective Physical Exam  Cardiovascular: Normal rate, regular rhythm and normal heart sounds.    Pulmonary/Chest Effort normal and breath sounds normal.    Breasts:  Bilateral pseudoptosis with Wise pattern scars Induration right breast over lower pole Right NAC inset from 9 o clock to 1130 unchanged depressed contour scar in this area- there is no open wound in this area today Right vertical scar with open wound 1 x 1 x 0.1 cm base granulated, undermining skin near circumferential without communication to NAC   Assessment/Plan Macromastia s/p reduction mammaplasty   Chronic recurring wound right lateral breast. At last visit patient reported she had US  breast scheduled. She did not complete this.    The NAC wound is healed today. Patient desires to proceed with plan excision of this area and closure due to recurrent opening/drainage. Plan to address new wound right breast. We have discussed the chronic wounds and opening are likely related to fat necrosis. Counseled I cannot assure her all fat necrosis will be removed and procedure itself can  produce  additional trauma to tissue and further fat necrosis.    Plan in OR, OP surgery. Patient  states her work requests 4 week leave- asked patient to provide this request in writing.   Will provide Rx for pain on day of surgery. Patient has Bactrim  at home.    Alger Infield, MD [facility]  Office/ physician access line after hours [phone]

## 2024-04-08 ENCOUNTER — Ambulatory Visit (HOSPITAL_BASED_OUTPATIENT_CLINIC_OR_DEPARTMENT_OTHER): Payer: Self-pay | Admitting: Anesthesiology

## 2024-04-08 ENCOUNTER — Ambulatory Visit (HOSPITAL_BASED_OUTPATIENT_CLINIC_OR_DEPARTMENT_OTHER)
Admission: RE | Admit: 2024-04-08 | Discharge: 2024-04-08 | Disposition: A | Discharge status: Home / Self Care | Attending: Plastic Surgery | Admitting: Plastic Surgery

## 2024-04-08 ENCOUNTER — Other Ambulatory Visit: Payer: Self-pay

## 2024-04-08 ENCOUNTER — Encounter (HOSPITAL_BASED_OUTPATIENT_CLINIC_OR_DEPARTMENT_OTHER): Payer: Self-pay | Admitting: Plastic Surgery

## 2024-04-08 ENCOUNTER — Encounter (HOSPITAL_BASED_OUTPATIENT_CLINIC_OR_DEPARTMENT_OTHER)
Admission: RE | Disposition: A | Discharge status: Home / Self Care | Payer: Self-pay | Source: Home / Self Care | Attending: Plastic Surgery

## 2024-04-08 DIAGNOSIS — Y838 Other surgical procedures as the cause of abnormal reaction of the patient, or of later complication, without mention of misadventure at the time of the procedure: Secondary | ICD-10-CM | POA: Insufficient documentation

## 2024-04-08 DIAGNOSIS — Z853 Personal history of malignant neoplasm of breast: Secondary | ICD-10-CM | POA: Diagnosis not present

## 2024-04-08 DIAGNOSIS — Z6839 Body mass index (BMI) 39.0-39.9, adult: Secondary | ICD-10-CM | POA: Insufficient documentation

## 2024-04-08 DIAGNOSIS — I10 Essential (primary) hypertension: Secondary | ICD-10-CM | POA: Diagnosis not present

## 2024-04-08 DIAGNOSIS — T8189XA Other complications of procedures, not elsewhere classified, initial encounter: Secondary | ICD-10-CM | POA: Insufficient documentation

## 2024-04-08 DIAGNOSIS — S21001A Unspecified open wound of right breast, initial encounter: Secondary | ICD-10-CM | POA: Diagnosis present

## 2024-04-08 DIAGNOSIS — E66813 Obesity, class 3: Secondary | ICD-10-CM | POA: Insufficient documentation

## 2024-04-08 DIAGNOSIS — K219 Gastro-esophageal reflux disease without esophagitis: Secondary | ICD-10-CM | POA: Diagnosis not present

## 2024-04-08 DIAGNOSIS — L905 Scar conditions and fibrosis of skin: Secondary | ICD-10-CM | POA: Diagnosis present

## 2024-04-08 DIAGNOSIS — Z01818 Encounter for other preprocedural examination: Secondary | ICD-10-CM

## 2024-04-08 HISTORY — PX: LESION EXCISION WITH COMPLEX REPAIR: SHX6700

## 2024-04-08 LAB — POCT PREGNANCY, URINE: Preg Test, Ur: NEGATIVE

## 2024-04-08 SURGERY — LESION EXCISION WITH COMPLEX REPAIR
Anesthesia: General | Site: Breast | Laterality: Right

## 2024-04-08 MED ORDER — CELECOXIB 200 MG PO CAPS
200.0000 mg | ORAL_CAPSULE | ORAL | Status: AC
  Administered 2024-04-08: 200 mg via ORAL

## 2024-04-08 MED ORDER — DROPERIDOL 2.5 MG/ML IJ SOLN
0.6250 mg | Freq: Once | INTRAMUSCULAR | Status: DC | PRN
Start: 2024-04-08 — End: 2024-04-08

## 2024-04-08 MED ORDER — MIDAZOLAM HCL 2 MG/2ML IJ SOLN
INTRAMUSCULAR | Status: AC
  Filled 2024-04-08: qty 2

## 2024-04-08 MED ORDER — CEFAZOLIN SODIUM-DEXTROSE 2-4 GM/100ML-% IV SOLN
2.0000 g | INTRAVENOUS | Status: AC
  Administered 2024-04-08: 2 g via INTRAVENOUS

## 2024-04-08 MED ORDER — OXYCODONE HCL 5 MG/5ML PO SOLN: 5.0000 mg | Freq: Once | ORAL | Status: DC | PRN

## 2024-04-08 MED ORDER — BUPIVACAINE HCL (PF) 0.5 % IJ SOLN
INTRAMUSCULAR | Status: AC
  Filled 2024-04-08: qty 30

## 2024-04-08 MED ORDER — GABAPENTIN 300 MG PO CAPS
300.0000 mg | ORAL_CAPSULE | ORAL | Status: AC
  Administered 2024-04-08: 300 mg via ORAL

## 2024-04-08 MED ORDER — ACETAMINOPHEN 500 MG PO TABS
1000.0000 mg | ORAL_TABLET | ORAL | Status: AC
  Administered 2024-04-08: 1000 mg via ORAL

## 2024-04-08 MED ORDER — BUPIVACAINE HCL (PF) 0.5 % IJ SOLN
INTRAMUSCULAR | Status: DC | PRN
  Administered 2024-04-08: 20 mL

## 2024-04-08 MED ORDER — GABAPENTIN 300 MG PO CAPS
ORAL_CAPSULE | ORAL | Status: AC
  Filled 2024-04-08: qty 1

## 2024-04-08 MED ORDER — FENTANYL CITRATE (PF) 100 MCG/2ML IJ SOLN
INTRAMUSCULAR | Status: AC
  Filled 2024-04-08: qty 2

## 2024-04-08 MED ORDER — LIDOCAINE 2% (20 MG/ML) 5 ML SYRINGE
INTRAMUSCULAR | Status: DC | PRN
  Administered 2024-04-08: 100 mg via INTRAVENOUS

## 2024-04-08 MED ORDER — MEPERIDINE HCL 25 MG/ML IJ SOLN: 6.2500 mg | INTRAMUSCULAR | Status: DC | PRN

## 2024-04-08 MED ORDER — OXYCODONE HCL 5 MG PO TABS: 5.0000 mg | ORAL_TABLET | Freq: Once | ORAL | Status: DC | PRN

## 2024-04-08 MED ORDER — 0.9 % SODIUM CHLORIDE (POUR BTL) OPTIME
TOPICAL | Status: DC | PRN
  Administered 2024-04-08: 200 mL

## 2024-04-08 MED ORDER — SCOPOLAMINE 1 MG/3DAYS TD PT72
MEDICATED_PATCH | TRANSDERMAL | Status: DC | PRN
  Administered 2024-04-08: 1 via TRANSDERMAL

## 2024-04-08 MED ORDER — ACETAMINOPHEN 500 MG PO TABS
ORAL_TABLET | ORAL | Status: AC
Start: 2024-04-08 — End: ?
  Filled 2024-04-08: qty 2

## 2024-04-08 MED ORDER — PHENYLEPHRINE HCL (PRESSORS) 10 MG/ML IV SOLN
INTRAVENOUS | Status: DC | PRN
  Administered 2024-04-08 (×2): 80 ug via INTRAVENOUS

## 2024-04-08 MED ORDER — ESMOLOL HCL 100 MG/10ML IV SOLN
INTRAVENOUS | Status: DC | PRN
Start: 2024-04-08 — End: 2024-04-08
  Administered 2024-04-08: 10 mg via INTRAVENOUS

## 2024-04-08 MED ORDER — HYDROMORPHONE HCL 1 MG/ML IJ SOLN: 0.2500 mg | INTRAMUSCULAR | Status: DC | PRN

## 2024-04-08 MED ORDER — LACTATED RINGERS IV SOLN: INTRAVENOUS | Status: DC

## 2024-04-08 MED ORDER — DEXAMETHASONE SODIUM PHOSPHATE 4 MG/ML IJ SOLN
INTRAMUSCULAR | Status: DC | PRN
  Administered 2024-04-08: 5 mg via INTRAVENOUS

## 2024-04-08 MED ORDER — FENTANYL CITRATE (PF) 100 MCG/2ML IJ SOLN
INTRAMUSCULAR | Status: DC | PRN
  Administered 2024-04-08 (×2): 50 ug via INTRAVENOUS

## 2024-04-08 MED ORDER — CELECOXIB 200 MG PO CAPS
ORAL_CAPSULE | ORAL | Status: AC
  Filled 2024-04-08: qty 1

## 2024-04-08 MED ORDER — MIDAZOLAM HCL 5 MG/5ML IJ SOLN
INTRAMUSCULAR | Status: DC | PRN
  Administered 2024-04-08: 2 mg via INTRAVENOUS

## 2024-04-08 MED ORDER — METOPROLOL TARTRATE 5 MG/5ML IV SOLN: 5.0000 mg | INTRAVENOUS | Status: DC | PRN

## 2024-04-08 MED ORDER — TRAMADOL HCL 50 MG PO TABS: 50.0000 mg | ORAL_TABLET | Freq: Four times a day (QID) | ORAL | 0 refills | Status: AC | PRN

## 2024-04-08 MED ORDER — LIDOCAINE HCL (CARDIAC) PF 100 MG/5ML IV SOSY
PREFILLED_SYRINGE | INTRAVENOUS | Status: DC | PRN
  Administered 2024-04-08: 100 mg via INTRAVENOUS

## 2024-04-08 MED ORDER — CEFAZOLIN SODIUM-DEXTROSE 2-4 GM/100ML-% IV SOLN
INTRAVENOUS | Status: AC
Start: 2024-04-08 — End: ?
  Filled 2024-04-08: qty 100

## 2024-04-08 MED ORDER — PROPOFOL 10 MG/ML IV BOLUS
INTRAVENOUS | Status: DC | PRN
  Administered 2024-04-08: 200 mg via INTRAVENOUS

## 2024-04-08 MED ORDER — ONDANSETRON HCL 4 MG/2ML IJ SOLN
INTRAMUSCULAR | Status: DC | PRN
Start: 2024-04-08 — End: 2024-04-08
  Administered 2024-04-08: 4 mg via INTRAVENOUS

## 2024-04-08 MED ORDER — SCOPOLAMINE 1 MG/3DAYS TD PT72
MEDICATED_PATCH | TRANSDERMAL | Status: AC
  Filled 2024-04-08: qty 1

## 2024-04-08 SURGICAL SUPPLY — 45 items
BLADE CLIPPER SURG (BLADE) IMPLANT
BLADE SURG 15 STRL LF DISP TIS (BLADE) ×1 IMPLANT
COTTONBALL LRG STERILE PKG (GAUZE/BANDAGES/DRESSINGS) IMPLANT
COVER BACK TABLE 60X90IN (DRAPES) IMPLANT
COVER MAYO STAND STRL (DRAPES) ×1 IMPLANT
DERMABOND ADVANCED .7 DNX12 (GAUZE/BANDAGES/DRESSINGS) IMPLANT
DRAPE LAPAROTOMY 100X72 PEDS (DRAPES) IMPLANT
DRAPE U-SHAPE 76X120 STRL (DRAPES) IMPLANT
DRAPE UTILITY XL STRL (DRAPES) ×1 IMPLANT
ELECT COATED BLADE 2.86 ST (ELECTRODE) IMPLANT
ELECT NDL BLADE 2-5/6 (NEEDLE) ×1 IMPLANT
ELECT NEEDLE BLADE 2-5/6 (NEEDLE) ×1 IMPLANT
ELECTRODE REM PT RETRN 9FT PED (ELECTROSURGICAL) IMPLANT
ELECTRODE REM PT RTRN 9FT ADLT (ELECTROSURGICAL) IMPLANT
GAUZE SPONGE 2X2 STRL 8-PLY (GAUZE/BANDAGES/DRESSINGS) IMPLANT
GAUZE XEROFORM 1X8 LF (GAUZE/BANDAGES/DRESSINGS) IMPLANT
GAUZE XEROFORM 5X9 LF (GAUZE/BANDAGES/DRESSINGS) IMPLANT
GLOVE BIO SURGEON STRL SZ 6 (GLOVE) ×1 IMPLANT
GOWN STRL REUS W/ TWL LRG LVL3 (GOWN DISPOSABLE) ×2 IMPLANT
NDL HYPO 30GX1 BEV (NEEDLE) IMPLANT
NDL PRECISIONGLIDE 27X1.5 (NEEDLE) IMPLANT
NEEDLE HYPO 30GX1 BEV (NEEDLE) IMPLANT
NEEDLE PRECISIONGLIDE 27X1.5 (NEEDLE) IMPLANT
PACK BASIN DAY SURGERY FS (CUSTOM PROCEDURE TRAY) ×1 IMPLANT
PENCIL SMOKE EVACUATOR (MISCELLANEOUS) ×1 IMPLANT
SHEET MEDIUM DRAPE 40X70 STRL (DRAPES) IMPLANT
SLEEVE SCD COMPRESS KNEE MED (STOCKING) IMPLANT
SPIKE FLUID TRANSFER (MISCELLANEOUS) IMPLANT
SPONGE T-LAP 18X18 ~~LOC~~+RFID (SPONGE) IMPLANT
STRIP CLOSURE SKIN 1/2X4 (GAUZE/BANDAGES/DRESSINGS) IMPLANT
STRIP CLOSURE SKIN 1/4X4 (GAUZE/BANDAGES/DRESSINGS) IMPLANT
SUT ETHILON 4 0 PS 2 18 (SUTURE) IMPLANT
SUT MNCRL AB 3-0 PS2 18 (SUTURE) IMPLANT
SUT MNCRL AB 4-0 PS2 18 (SUTURE) IMPLANT
SUT MON AB 3-0 SH27 (SUTURE) IMPLANT
SUT PROLENE 5 0 P 3 (SUTURE) IMPLANT
SUT PROLENE 5 0 PS 2 (SUTURE) IMPLANT
SUT PROLENE 6 0 P 1 18 (SUTURE) IMPLANT
SUT VIC AB 3-0 SH 27X BRD (SUTURE) IMPLANT
SUT VICRYL RAPIDE 4-0 (SUTURE) IMPLANT
SWABSTICK POVIDONE IODINE SNGL (MISCELLANEOUS) IMPLANT
SYR BULB EAR ULCER 3OZ GRN STR (SYRINGE) IMPLANT
SYR CONTROL 10ML LL (SYRINGE) ×1 IMPLANT
TOWEL GREEN STERILE FF (TOWEL DISPOSABLE) ×1 IMPLANT
TRAY DSU PREP LF (CUSTOM PROCEDURE TRAY) IMPLANT

## 2024-04-08 NOTE — Discharge Instructions (Addendum)
 No Tylenol  until 2:07 pm today, if needed. No Ibuprofen until after 4:07 PM today if needed

## 2024-04-08 NOTE — Anesthesia Preprocedure Evaluation (Addendum)
 Anesthesia Evaluation  Patient identified by MRN, date of birth, ID band Patient awake    Reviewed: Allergy & Precautions, NPO status , Patient's Chart, lab work & pertinent test results  History of Anesthesia Complications Negative for: history of anesthetic complications  Airway Mallampati: III  TM Distance: >3 FB Neck ROM: Full    Dental  (+) Dental Advisory Given, Poor Dentition, Chipped,    Pulmonary neg pulmonary ROS   Pulmonary exam normal breath sounds clear to auscultation       Cardiovascular hypertension, Pt. on medications Normal cardiovascular exam Rhythm:Regular Rate:Normal     Neuro/Psych  Headaches  Anxiety        GI/Hepatic Neg liver ROS,GERD  Controlled,,  Endo/Other    Class 3 obesity  Renal/GU negative Renal ROS     Musculoskeletal negative musculoskeletal ROS (+)    Abdominal  (+) + obese  Peds  Hematology negative hematology ROS (+)   Anesthesia Other Findings macromastia, chronic neck and back pain, intertrigo  Reproductive/Obstetrics negative OB ROS                             Anesthesia Physical Anesthesia Plan  ASA: 3  Anesthesia Plan: General   Post-op Pain Management: Tylenol  PO (pre-op)*, Gabapentin  PO (pre-op)* and Celebrex  PO (pre-op)*   Induction: Intravenous  PONV Risk Score and Plan: 3 and Treatment may vary due to age or medical condition, Midazolam , Dexamethasone  and Ondansetron   Airway Management Planned: LMA  Additional Equipment: None  Intra-op Plan:   Post-operative Plan: Extubation in OR  Informed Consent: I have reviewed the patients History and Physical, chart, labs and discussed the procedure including the risks, benefits and alternatives for the proposed anesthesia with the patient or authorized representative who has indicated his/her understanding and acceptance.     Dental advisory given  Plan Discussed with:  CRNA  Anesthesia Plan Comments:         Anesthesia Quick Evaluation

## 2024-04-08 NOTE — Op Note (Signed)
 Operative Note   DATE OF OPERATION: 6.3.2025  LOCATION: [facility]-outpatient  SURGICAL DIVISION: Plastic Surgery  PREOPERATIVE DIAGNOSES:  1. Open wound right breast 2. History breast reduction  POSTOPERATIVE DIAGNOSES:  same  PROCEDURE: 1. 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 2. Layered closure breast 4 cm  SURGEON: Alger Infield MD MBA  ASSISTANT: none  ANESTHESIA:  General.   EBL: 10 ml  COMPLICATIONS: None immediate.   INDICATIONS FOR PROCEDURE:  The patient, Melinda Frost, is a 38 y.o. female born on [birth year], is here for treatment recurrent wound right breast following breast reduction and development fat necrosis.    FINDINGS: Granulated open wound right breast 2 cm diameter, resolved undermining. Liposuction completed to area beneath wound to aerate area clinical fat necrosis and encountered one are few cc liquefied fat.   DESCRIPTION OF PROCEDURE:  The patient's operative site was marked with the patient in the preoperative area. The patient was taken to the operating room. SCDs were placed and IV antibiotics were given. The patient's operative site was prepped and draped in a sterile fashion. A time out was performed and all information was confirmed to be correct. Local anesthetic infiltrated surrounding wound and in area anticipated liposuction. Sharp excision skin and subcutaneous fat completed with knife 5 cm2. Liposuction cannula used to aerate palpable fat necrosis. Liposuction completed also to area, aspirate approximately 5 cc. Wound irrigated. Hemostasis obtained. Layered closure completed with 3-0 monocryl in superficial fascia and dermis followed by interrupted simple and horizontal mattress 4-0 nylon sutures for skin closure, length 4 cm. Steri strips applied followed by dry dressing, Medipore tape.   The patient was allowed to wake from anesthesia, extubated and taken to the recovery room in satisfactory condition.    SPECIMENS: right breast wound   DRAINS: none  Alger Infield, MD [facility]  Office/ physician access line after hours [phone]

## 2024-04-08 NOTE — Anesthesia Procedure Notes (Signed)
 Procedure Name: LMA Insertion Date/Time: 04/08/2024 9:00 AM  Performed by: Eugenia Hess, CRNAPre-anesthesia Checklist: Patient identified, Emergency Drugs available, Suction available and Patient being monitored Patient Re-evaluated:Patient Re-evaluated prior to induction Oxygen Delivery Method: Circle System Utilized Preoxygenation: Pre-oxygenation with 100% oxygen Induction Type: IV induction Ventilation: Mask ventilation without difficulty LMA: LMA inserted LMA Size: 4.0 Number of attempts: 1 Airway Equipment and Method: bite block Placement Confirmation: positive ETCO2 Tube secured with: Tape Dental Injury: Teeth and Oropharynx as per pre-operative assessment

## 2024-04-08 NOTE — Transfer of Care (Signed)
 Immediate Anesthesia Transfer of Care Note  Patient: Melinda Frost  Procedure(s) Performed: LESION EXCISION WITH COMPLEX REPAIR (Right: Breast)  Patient Location: PACU  Anesthesia Type:General  Level of Consciousness: sedated  Airway & Oxygen Therapy: Patient Spontanous Breathing and Patient connected to face mask oxygen  Post-op Assessment: Report given to RN and Post -op Vital signs reviewed and stable  Post vital signs: Reviewed and stable  Last Vitals:  Vitals Value Taken Time  BP 136/71 04/08/24 0959  Temp    Pulse 132 04/08/24 1000  Resp 23 04/08/24 1000  SpO2 98 % 04/08/24 1000  Vitals shown include unfiled device data.  Last Pain:  Vitals:   04/08/24 0754  TempSrc: Temporal  PainSc: 0-No pain         Complications: No notable events documented.

## 2024-04-08 NOTE — Anesthesia Postprocedure Evaluation (Signed)
 Anesthesia Post Note  Patient: Melinda Frost  Procedure(s) Performed: LESION EXCISION WITH COMPLEX REPAIR (Right: Breast)     Patient location during evaluation: PACU Anesthesia Type: General Level of consciousness: sedated and patient cooperative Pain management: pain level controlled Vital Signs Assessment: post-procedure vital signs reviewed and stable Respiratory status: spontaneous breathing Cardiovascular status: stable Anesthetic complications: no  No notable events documented.  Last Vitals:  Vitals:   04/08/24 1030 04/08/24 1109  BP: (!) 146/80 (!) 143/89  Pulse: (!) 110 (!) 105  Resp: 19 16  Temp:  (!) 36.3 C  SpO2: 98% 100%    Last Pain:  Vitals:   04/08/24 1109  TempSrc:   PainSc: 1                  Gorman Laughter

## 2024-04-08 NOTE — Interval H&P Note (Signed)
 History and Physical Interval Note:  04/08/2024 7:25 AM  Melinda Frost  has presented today for surgery, with the diagnosis of open wound right breast.  The various methods of treatment have been discussed with the patient and family. After consideration of risks, benefits and other options for treatment, the patient has consented to  Procedure(s) with comments: LESION EXCISION WITH COMPLEX REPAIR (Right) - ** EXCISION CHRONIC WOUND RIGHT BREAST** as a surgical intervention.  The patient's history has been reviewed, patient examined, no change in status, stable for surgery.  I have reviewed the patient's chart and labs.  Questions were answered to the patient's satisfaction.     Lisabeth Rider Nuvia Hileman

## 2024-04-09 ENCOUNTER — Encounter (HOSPITAL_BASED_OUTPATIENT_CLINIC_OR_DEPARTMENT_OTHER): Payer: Self-pay | Admitting: Plastic Surgery

## 2024-04-09 LAB — SURGICAL PATHOLOGY
